# Patient Record
Sex: Female | Born: 1985
Health system: Southern US, Community
[De-identification: ages and names within clinical notes are randomized; demographics above are authoritative.]

## PROBLEM LIST (undated history)

## (undated) ENCOUNTER — Inpatient Hospital Stay (HOSPITAL_COMMUNITY): Payer: Self-pay

## (undated) DIAGNOSIS — IMO0002 Reserved for concepts with insufficient information to code with codable children: Secondary | ICD-10-CM

## (undated) DIAGNOSIS — J45909 Unspecified asthma, uncomplicated: Secondary | ICD-10-CM

## (undated) DIAGNOSIS — Z8669 Personal history of other diseases of the nervous system and sense organs: Secondary | ICD-10-CM

## (undated) DIAGNOSIS — R87619 Unspecified abnormal cytological findings in specimens from cervix uteri: Secondary | ICD-10-CM

## (undated) DIAGNOSIS — R51 Headache: Secondary | ICD-10-CM

## (undated) HISTORY — PX: CERVICAL CONE BIOPSY: SUR198

## (undated) HISTORY — DX: Personal history of other diseases of the nervous system and sense organs: Z86.69

## (undated) HISTORY — PX: ANKLE FRACTURE SURGERY: SHX122

## (undated) HISTORY — PX: OTHER SURGICAL HISTORY: SHX169

---
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
## 2007-08-02 ENCOUNTER — Encounter (INDEPENDENT_AMBULATORY_CARE_PROVIDER_SITE_OTHER): Payer: Self-pay | Admitting: Obstetrics and Gynecology

## 2007-08-02 ENCOUNTER — Ambulatory Visit (HOSPITAL_COMMUNITY): Admission: RE | Admit: 2007-08-02 | Discharge: 2007-08-02 | Payer: Self-pay | Admitting: Obstetrics and Gynecology

## 2008-07-02 ENCOUNTER — Emergency Department (HOSPITAL_COMMUNITY): Admission: EM | Admit: 2008-07-02 | Discharge: 2008-07-02 | Payer: Self-pay | Admitting: Emergency Medicine

## 2009-03-01 ENCOUNTER — Emergency Department (HOSPITAL_COMMUNITY): Admission: EM | Admit: 2009-03-01 | Discharge: 2009-03-02 | Payer: Self-pay | Admitting: Emergency Medicine

## 2009-03-07 ENCOUNTER — Emergency Department (HOSPITAL_COMMUNITY): Admission: EM | Admit: 2009-03-07 | Discharge: 2009-03-08 | Payer: Self-pay | Admitting: Emergency Medicine

## 2009-03-14 ENCOUNTER — Emergency Department (HOSPITAL_COMMUNITY): Admission: EM | Admit: 2009-03-14 | Discharge: 2009-03-14 | Payer: Self-pay | Admitting: Emergency Medicine

## 2009-03-17 ENCOUNTER — Inpatient Hospital Stay (HOSPITAL_COMMUNITY): Admission: AD | Admit: 2009-03-17 | Discharge: 2009-03-17 | Payer: Self-pay | Admitting: Family Medicine

## 2009-11-19 ENCOUNTER — Inpatient Hospital Stay (HOSPITAL_COMMUNITY): Admission: AD | Admit: 2009-11-19 | Discharge: 2009-11-19 | Payer: Self-pay | Admitting: Obstetrics and Gynecology

## 2009-12-14 ENCOUNTER — Emergency Department (HOSPITAL_COMMUNITY): Admission: EM | Admit: 2009-12-14 | Discharge: 2009-12-14 | Payer: Self-pay | Admitting: Emergency Medicine

## 2010-06-05 ENCOUNTER — Inpatient Hospital Stay (HOSPITAL_COMMUNITY): Admission: AD | Admit: 2010-06-05 | Discharge: 2010-06-05 | Payer: Self-pay | Admitting: Obstetrics and Gynecology

## 2010-06-06 ENCOUNTER — Inpatient Hospital Stay (HOSPITAL_COMMUNITY): Admission: AD | Admit: 2010-06-06 | Discharge: 2010-06-06 | Payer: Self-pay | Admitting: Obstetrics and Gynecology

## 2010-06-23 ENCOUNTER — Inpatient Hospital Stay (HOSPITAL_COMMUNITY): Admission: AD | Admit: 2010-06-23 | Discharge: 2010-06-24 | Payer: Self-pay | Admitting: Obstetrics and Gynecology

## 2010-06-24 ENCOUNTER — Inpatient Hospital Stay (HOSPITAL_COMMUNITY): Admission: AD | Admit: 2010-06-24 | Discharge: 2010-06-27 | Payer: Self-pay | Admitting: Obstetrics & Gynecology

## 2011-03-06 LAB — CBC
HCT: 37.2 % (ref 36.0–46.0)
HCT: 40.5 % (ref 36.0–46.0)
Hemoglobin: 13.8 g/dL (ref 12.0–15.0)
MCH: 32 pg (ref 26.0–34.0)
MCH: 32.3 pg (ref 26.0–34.0)
MCHC: 34.1 g/dL (ref 30.0–36.0)
MCHC: 34.2 g/dL (ref 30.0–36.0)
MCV: 93.8 fL (ref 78.0–100.0)
MCV: 94.3 fL (ref 78.0–100.0)
RDW: 14.4 % (ref 11.5–15.5)

## 2011-03-21 LAB — URINE MICROSCOPIC-ADD ON

## 2011-03-21 LAB — BASIC METABOLIC PANEL
Chloride: 108 mEq/L (ref 96–112)
Creatinine, Ser: 0.58 mg/dL (ref 0.4–1.2)
GFR calc Af Amer: >60 mL/min (ref >60)

## 2011-03-21 LAB — CBC
MCV: 93.7 fL (ref 78.0–100.0)
RBC: 4.26 MIL/uL (ref 3.87–5.11)
WBC: 6.9 10*3/uL (ref 4.0–10.5)

## 2011-03-21 LAB — URINALYSIS, ROUTINE W REFLEX MICROSCOPIC
Bilirubin Urine: NEGATIVE
Hgb urine dipstick: NEGATIVE
Protein, ur: 30 mg/dL — AB
Specific Gravity, Urine: 1.026 (ref 1.005–1.030)
Urobilinogen, UA: 1 mg/dL (ref 0.0–1.0)

## 2011-03-21 LAB — DIFFERENTIAL
Lymphocytes Relative: 21 % (ref 12–46)
Lymphs Abs: 1.5 10*3/uL (ref 0.7–4.0)
Monocytes Relative: 7 % (ref 3–12)
Neutro Abs: 5 10*3/uL (ref 1.7–7.7)
Neutrophils Relative %: 72 % (ref 43–77)

## 2011-03-22 LAB — URINALYSIS, ROUTINE W REFLEX MICROSCOPIC
Glucose, UA: NEGATIVE mg/dL
Hgb urine dipstick: NEGATIVE
Specific Gravity, Urine: 1.01 (ref 1.005–1.030)
pH: 8 (ref 5.0–8.0)

## 2011-03-25 ENCOUNTER — Emergency Department (HOSPITAL_COMMUNITY)
Admission: EM | Admit: 2011-03-25 | Discharge: 2011-03-25 | Disposition: A | Discharge status: Home / Self Care | Payer: Self-pay | Attending: Emergency Medicine | Admitting: Emergency Medicine

## 2011-03-25 DIAGNOSIS — R110 Nausea: Secondary | ICD-10-CM | POA: Insufficient documentation

## 2011-03-25 DIAGNOSIS — H53149 Visual discomfort, unspecified: Secondary | ICD-10-CM | POA: Insufficient documentation

## 2011-03-25 DIAGNOSIS — G43909 Migraine, unspecified, not intractable, without status migrainosus: Secondary | ICD-10-CM | POA: Insufficient documentation

## 2011-03-31 LAB — URINE MICROSCOPIC-ADD ON

## 2011-03-31 LAB — URINALYSIS, ROUTINE W REFLEX MICROSCOPIC
Bilirubin Urine: NEGATIVE
Glucose, UA: NEGATIVE mg/dL
Glucose, UA: NEGATIVE mg/dL
Ketones, ur: NEGATIVE mg/dL
Nitrite: NEGATIVE
Specific Gravity, Urine: 1.022 (ref 1.005–1.030)
pH: 6.5 (ref 5.0–8.0)
pH: 7.5 (ref 5.0–8.0)

## 2011-03-31 LAB — WET PREP, GENITAL
Clue Cells Wet Prep HPF POC: NONE SEEN
Trich, Wet Prep: NONE SEEN
Trich, Wet Prep: NONE SEEN
Yeast Wet Prep HPF POC: NONE SEEN

## 2011-03-31 LAB — GC/CHLAMYDIA PROBE AMP, GENITAL
Chlamydia, DNA Probe: NEGATIVE
GC Probe Amp, Genital: NEGATIVE

## 2011-05-03 NOTE — Op Note (Signed)
NAME:  Joanna Mcpherson, Joanna Mcpherson            ACCOUNT NO.:  [id number]   MEDICAL RECORD NO.:  [id number]          PATIENT TYPE:  AMB   LOCATION:  SDC                           FACILITY:  [facility]   PHYSICIAN:  Miguel Aschoff, M.D.       DATE OF BIRTH:  07/19/1986   DATE OF PROCEDURE:  08/02/2007  DATE OF DISCHARGE:                               OPERATIVE REPORT   PREOPERATIVE DIAGNOSIS:  Carcinoma in situ of cervix.   POSTOPERATIVE DIAGNOSIS:  Carcinoma in situ of cervix.   PROCEDURE:  Cold-knife conization of cervix and endocervical curettage.   SURGEON:  Dr. Miguel Aschoff.   ANESTHESIA:  General.   COMPLICATIONS:  None.   JUSTIFICATION:  The patient is a 25 year old black female who came in  for routine evaluation and Pap smear on July 12, 2007.  On examination,  her pelvic exam was unremarkable.  However, Pap smear returned with CIS.  Due to this significant abnormality on her Pap smear, she presents now  to undergo cone biopsy of the cervix to ensure that a more significant  lesion is not present and to both confirm the pathology and to correct  the problem.  Risks and benefits of the procedure were discussed with  the patient.   PROCEDURE:  The patient was taken to the operating room and placed in a  supine position.  General anesthesia was administered without  difficulty.  She was then placed in the dorsal lithotomy position,  prepped and draped in the usual sterile fashion.  Once this was done, a  speculum was placed in the vaginal vault.  Anterior cervical lip was  grasped with a tenaculum.  Figure-of-eight sutures of 0 chromic were  placed from the 2 to 4 o'clock positions and from the 8 o'clock to 10  o'clock positions in an effort to occlude ascending branch of the  cervical artery.  Once this was completed, the cervix was stained with  Lugol's solution.  There was a very large ectocervical lesion  encompassing the vast majority of the cervix.  After Lugol's solution  was placed, the  cervix was injected with 18 mL of 1% Xylocaine with  epinephrine.  This was placed for hemostasis.  Equal amounts were placed  at the 12, 4 and 8 o'clock positions.  After the cervix was blanched  using a cold-knife, the lesion was excised.  The lesion was so extensive  it was not possible to clear all the margins with the scalpel.  The  residual lesions were then treated with electrocautery.  The specimen  was removed, tagged at the 12 o'clock position.  Endocervical curettage  was then carried out.  The bed of the cone biopsy site was then  cauterized with electrocautery, and then the entire ectocervix was  treated with electrocautery an effort to remove any residual dysplastic  tissue.  Once this was completed, the defect was filled with a Gelfoam  pack, and then the previously placed sutures were tied across the cervix  to hold the Gelfoam in place.  The estimated blood loss was less than 10  mL.  The patient  tolerated the procedure well.   Plan is for the patient to be discharged home.  Medications for home  include Darvocet-N 100 one every 4 hours as needed for pain, doxycycline  1 twice a day for 3 days.  The patient is to call in 1 week for her  pathology report and to be seen back in 4 weeks for follow-up  examination.  She was instructed to place nothing in the vagina for 4  weeks and to call if there were any problems such as fever, pain or  heavy bleeding.      Miguel Aschoff, M.D.  Electronically Signed     AR/MEDQ  D:  08/02/2007  T:  08/03/2007  Job:  [id number]

## 2011-05-06 NOTE — Op Note (Signed)
NAME:  Joanna Mcpherson, Joanna Mcpherson            ACCOUNT NO.:  [id number]   MEDICAL RECORD NO.:  [id number]          PATIENT TYPE:  OIB   LOCATION:  5003                         FACILITY:  [facility]   PHYSICIAN:  Almedia Balls. Ranell Patrick, M.D. DATE OF BIRTH:  Apr 27, 1986   DATE OF PROCEDURE:  11/28/2005  DATE OF DISCHARGE:                                 OPERATIVE REPORT   PREOPERATIVE DIAGNOSIS:  Displaced pronation, external rotation ankle  fracture, unstable.   POSTOPERATIVE DIAGNOSIS:  Displaced pronation, external rotation ankle  fracture, unstable.   PROCEDURE PERFORMED:  Open reduction and internal fixation of left pronation  and external rotation ankle fracture with placement of syndesmosis screw.   ATTENDING SURGEON:  Almedia Balls. Ranell Patrick, M.D.   ASSISTANT:  Donnie Coffin. Durwin Nora, P.A.   General anesthesia was used.   ESTIMATED BLOOD LOSS:  Minimal.   TOURNIQUET TIME:  1 hour 10 minutes.   Instrument counts correct, no complications.  Perioperative antibiotics  given.   INDICATIONS:  The patient is an 25 year old female with a history of a  pronation-external rotation type injury to her ankle.  She presented to the  orthopedic clinic with a displaced mortise and a high fibular fracture,  indicating disruption of the distal tibiofibular syndesmosis.  The patient's  family and the patient were counseled regarding the unstable nature of this  injury and the need for reduction and fixation of the fracture and  stabilization of the syndesmosis.  Informed consent was obtained.   DESCRIPTION OF PROCEDURE:  After an adequate level of anesthesia was  achieved, the patient was positioned supine on the radiolucent operating  table.  The patient was sterilely prepped and draped in the usual manner.  After the placement of a nonsterile tourniquet, the limb was elevated and  exsanguinated using an Esmarch bandage.  The tourniquet was inflated to 300  mmHg.  A longitudinal skin incision was created with the  subcutaneous border  of the fibula, dissection carried sharply down through subcutaneous tissues.  The fibular was subperiosteally dissected.  The fracture site was identified  and anatomically reduced with crab claw clamps.  We then placed two anterior  and posterior lag screws, getting good fixation and stabilization of the  fracture.  We then placed a lateral neutralization plate, which was 3 5  reconstruction plate, 10 holes, which spanned the fracture site and gave [place]  six cortices proximally and six cortices distally to the fracture.  We then  bent the plate and contoured it appropriately, fixed the plate to the bone,  gaining good purchase bicortically proximally and distally.  We then checked  the mortise on a mortise view, both AP and lateral.  We noted the anatomic  reduction and fixation of the fibula; however, we noted some slight widening  still at the medial joint space, medial clear space.  We thus decided to go  ahead and make a small incision to inspect the medial joint.  We did through  a curvilinear incision directly overlying the anterior medial joint line.  Dissection carried down onto the saphenous vein.  We dissected that and  retracted it posteriorly  and then identified the joint line anteriorly.  We  punctured that with a Metzenbaum scissors and then gently opened that area  up, and we were able to notice the medial deltoid ligament was flipped into  the medial joint space.  We cleaned that, flipped that out, utilizing a  Therapist, nutritional, and there was also some redundant organizing clot and  hematoma and a little bit of fat that was drifting into the anteromedial  joint space.  We trimmed that out sharply using a 15 blade scalpel and  DeBakey forceps.  The remainder of the anterior joint was thoroughly  inspected and lavaged.  There was one small chip of cartilage off of the  anterior aspect of the medial malleolus that appeared to be traumatic and  related to her  injury.  Again we checked the deltoid to make sure that it  was out and then anatomically reduced the mortise under direct  visualization.  With the mortise reduced, we placed a Brooke Dare Kong clamp to  hold the mortise reduced and the distal syndesmosis reduced.  Then we placed  a single 4.0 fully-threaded cancellous screw through the distal hole in the  plate and across to the medial side of the tibia, gaining good purchase and  good fixation.  We were happy with reduction of the mortise now that it  appeared anatomic and with placement of the screw in multiple views.  Again  the ankle and the hardware were checked in multiple planes, and we were  happy with the placement and the fracture reduction.  The final pictures  were obtained.  We then thoroughly irrigated the joint, both medial and  lateral wounds, and then closed the wounds with layered closures of 2-0  Vicryl and a 4-0 running Monocryl for the skin.  The patient tolerated  surgery well, was taken to the recovery room.           ______________________________  Almedia Balls Ranell Patrick, M.D.     SRN/MEDQ  D:  11/28/2005  T:  11/29/2005  Job:  [id number]

## 2011-09-16 LAB — URINALYSIS, ROUTINE W REFLEX MICROSCOPIC
Bilirubin Urine: NEGATIVE
Glucose, UA: NEGATIVE
Hgb urine dipstick: NEGATIVE
Ketones, ur: NEGATIVE
Protein, ur: NEGATIVE
Urobilinogen, UA: 0.2

## 2011-09-16 LAB — GC/CHLAMYDIA PROBE AMP, GENITAL
Chlamydia, DNA Probe: NEGATIVE
GC Probe Amp, Genital: NEGATIVE

## 2011-09-16 LAB — WET PREP, GENITAL: Trich, Wet Prep: NONE SEEN

## 2012-05-16 ENCOUNTER — Encounter (HOSPITAL_COMMUNITY): Payer: Self-pay | Admitting: Emergency Medicine

## 2012-05-16 ENCOUNTER — Emergency Department (HOSPITAL_COMMUNITY)
Admission: EM | Admit: 2012-05-16 | Discharge: 2012-05-16 | Disposition: A | Discharge status: Home / Self Care | Payer: Self-pay | Attending: Emergency Medicine | Admitting: Emergency Medicine

## 2012-05-16 DIAGNOSIS — R1909 Other intra-abdominal and pelvic swelling, mass and lump: Secondary | ICD-10-CM | POA: Insufficient documentation

## 2012-05-16 DIAGNOSIS — B373 Candidiasis of vulva and vagina: Secondary | ICD-10-CM

## 2012-05-16 DIAGNOSIS — N898 Other specified noninflammatory disorders of vagina: Secondary | ICD-10-CM | POA: Insufficient documentation

## 2012-05-16 DIAGNOSIS — B3731 Acute candidiasis of vulva and vagina: Secondary | ICD-10-CM | POA: Insufficient documentation

## 2012-05-16 LAB — WET PREP, GENITAL: Trich, Wet Prep: NONE SEEN

## 2012-05-16 LAB — URINALYSIS, ROUTINE W REFLEX MICROSCOPIC
Bilirubin Urine: NEGATIVE
Glucose, UA: NEGATIVE mg/dL
Hgb urine dipstick: NEGATIVE
Ketones, ur: NEGATIVE mg/dL
Leukocytes, UA: NEGATIVE
Nitrite: NEGATIVE
Protein, ur: NEGATIVE mg/dL
Specific Gravity, Urine: 1.011 (ref 1.005–1.030)
Urobilinogen, UA: 0.2 mg/dL (ref 0.0–1.0)
pH: 6.5 (ref 5.0–8.0)

## 2012-05-16 LAB — PREGNANCY, URINE: Preg Test, Ur: NEGATIVE

## 2012-05-16 MED ORDER — FLUCONAZOLE 150 MG PO TABS
150.0000 mg | ORAL_TABLET | Freq: Once | ORAL | Status: AC
  Administered 2012-05-16: 150 mg via ORAL
  Filled 2012-05-16: qty 1

## 2012-05-16 NOTE — ED Provider Notes (Addendum)
History     CSN: [id number]  Arrival date & time 05/16/12  1924   First MD Initiated Contact with Patient 05/16/12 2125      Chief Complaint  Patient presents with  . Groin Swelling    (Consider location/radiation/quality/duration/timing/severity/associated sxs/prior treatment) HPI Comments: Swelling and tenderness in peri region   The history is provided by the patient.    History reviewed. No pertinent past medical history.  Past Surgical History  Procedure Date  . Ankle fracture surgery     Family History  Problem Relation Age of Onset  . Diabetes Mother     History  Substance Use Topics  . Smoking status: Never Smoker   . Smokeless tobacco: Not on file  . Alcohol Use: No    OB History    Grav Para Term Preterm Abortions TAB SAB Ect Mult Living                  Review of Systems  Constitutional: Negative for fever.  Genitourinary: Positive for vaginal pain. Negative for dysuria, vaginal bleeding and vaginal discharge.    Allergies  Review of patient's allergies indicates no known allergies.  Home Medications  No current outpatient prescriptions on file.  BP 120/79  Pulse 94  Temp(Src) 98.3 F (36.8 C) (Oral)  Resp 18  SpO2 98%  LMP 04/26/2012  Physical Exam  Constitutional: She is oriented to person, place, and time. She appears well-developed.  Neck: Normal range of motion.  Cardiovascular: Normal rate.   Abdominal: Soft. She exhibits no distension. There is no tenderness.  Genitourinary: Uterus normal. Vaginal discharge found.       Thick white, curdy vaginal discharge  Neurological: She is alert and oriented to person, place, and time.  Skin: Skin is warm. No rash noted.    ED Course  Procedures (including critical care time)  Labs Reviewed  WET PREP, GENITAL - Abnormal; Notable for the following:    Yeast Wet Prep HPF POC FEW (*)    WBC, Wet Prep HPF POC FEW (*)    All other components within normal limits  URINALYSIS, ROUTINE  W REFLEX MICROSCOPIC  PREGNANCY, URINE  GC/CHLAMYDIA PROBE AMP, GENITAL   No results found.   No diagnosis found.    MDM  Vaginal candidiasis        Arman Filter, NP 05/16/12 2215  Arman Filter, NP 05/16/12 4098  Arman Filter, NP 08/31/12 [phone]

## 2012-05-16 NOTE — ED Notes (Signed)
Pt states she feels like she has swelling in her perineal area  Pt denies vaginal discharge  Pt states she has been using medication for yeast infection without relief  Pt states it is painful with urination and states she has been urinating frequently

## 2012-05-16 NOTE — Discharge Instructions (Signed)
Candidal Vulvovaginitis Candidal vulvovaginitis is an infection of the vagina and vulva. The vulva is the skin around the opening of the vagina. This may cause itching and discomfort in and around the vagina.  HOME CARE  Only take medicine as told by your doctor.   Do not have sex (intercourse) until the infection is healed or as told by your doctor.   Practice safe sex.   Tell your sex partner about your infection.   Do not douche or use tampons.   Wear cotton underwear. Do not wear tight pants or panty hose.   Eat yogurt. This may help treat and prevent yeast infections.  GET HELP RIGHT AWAY IF:   You have a fever.   Your problems get worse during treatment or do not get better in 3 days.   You have discomfort, irritation, or itching in your vagina or vulva area.   You have pain after sex.   You start to get belly (abdominal) pain.  MAKE SURE YOU:  Understand these instructions.   Will watch your condition.   Will get help right away if you are not doing well or get worse.  Document Released: 03/03/2009 Document Revised: 11/24/2011 Document Reviewed: 03/03/2009 [facility] Patient Information 2012 [place], [place]. Given an  Antifungal to treat your symptoms should be the only medication, you need

## 2012-05-18 NOTE — ED Notes (Signed)
+  Chlamydia. Chart sent to EDP office for review. DHHS attached. 

## 2012-05-19 NOTE — ED Notes (Signed)
Called patient and notified them of +result. Wants RX called to [facility] on [address].

## 2012-05-19 NOTE — ED Notes (Signed)
Chart returned from EDP office. Recommend Azithromycin 1000 mg PO one dose only. Prescribed by Fayrene Helper PA-C.

## 2012-05-23 NOTE — ED Provider Notes (Signed)
Medical screening examination/treatment/procedure(s) were performed by non-physician practitioner and as supervising physician I was immediately available for consultation/collaboration.  Raeford Razor, MD 05/23/12 2255

## 2012-08-25 ENCOUNTER — Encounter (HOSPITAL_COMMUNITY): Payer: Self-pay

## 2012-08-25 ENCOUNTER — Inpatient Hospital Stay (HOSPITAL_COMMUNITY)
Admission: AD | Admit: 2012-08-25 | Discharge: 2012-08-25 | Disposition: A | Discharge status: Home / Self Care | Payer: Self-pay | Source: Ambulatory Visit | Attending: Obstetrics and Gynecology | Admitting: Obstetrics and Gynecology

## 2012-08-25 DIAGNOSIS — IMO0002 Reserved for concepts with insufficient information to code with codable children: Secondary | ICD-10-CM

## 2012-08-25 DIAGNOSIS — Z3201 Encounter for pregnancy test, result positive: Secondary | ICD-10-CM | POA: Insufficient documentation

## 2012-08-25 LAB — POCT PREGNANCY, URINE: Preg Test, Ur: POSITIVE — AB

## 2012-08-25 MED ORDER — CONCEPT OB 130-92.4-1 MG PO CAPS: 1.0000 | ORAL_CAPSULE | Freq: Every day | ORAL | Status: DC

## 2012-08-25 NOTE — MAU Note (Signed)
States had 3 positive pregnancy tests at home. No complaints at this time. States needs a pregnancy verification letter.

## 2012-08-25 NOTE — MAU Provider Note (Signed)
HPI: Joanna Mcpherson is a 26 y.o. year old G38P1011 female at [redacted]w[redacted]d weeks gestation who presents to MAU requesting pregnancy verification letter. No pain or vaginal bleeding.  Pregnancy verification letter given. Start Prenatal care.  [place], [place] 08/25/2012 8:03 PM

## 2012-08-27 NOTE — MAU Provider Note (Signed)
Agree with above note.  Joanna Mcpherson 08/27/2012 1:14 PM

## 2012-08-29 ENCOUNTER — Inpatient Hospital Stay (HOSPITAL_COMMUNITY)
Admission: AD | Admit: 2012-08-29 | Discharge: 2012-08-29 | Discharge status: Against Medical Advice | Payer: Self-pay | Source: Ambulatory Visit | Attending: Obstetrics and Gynecology | Admitting: Obstetrics and Gynecology

## 2012-08-29 DIAGNOSIS — R109 Unspecified abdominal pain: Secondary | ICD-10-CM | POA: Insufficient documentation

## 2012-08-29 DIAGNOSIS — N949 Unspecified condition associated with female genital organs and menstrual cycle: Secondary | ICD-10-CM | POA: Insufficient documentation

## 2012-08-29 LAB — URINALYSIS, ROUTINE W REFLEX MICROSCOPIC
Bilirubin Urine: NEGATIVE
Ketones, ur: NEGATIVE mg/dL
Nitrite: NEGATIVE
pH: 7 (ref 5.0–8.0)

## 2012-08-29 NOTE — MAU Note (Signed)
Patient states she started having right side pain last night. Denies any bleeding or vaginal discharge.

## 2012-08-29 NOTE — MAU Note (Signed)
Pt requesting to sign out AMA.

## 2012-08-29 NOTE — MAU Note (Signed)
RN discussed with pt benefits of further exam and risk of leaving.  Pt voices understanding and AMA form signed.

## 2012-08-31 NOTE — ED Provider Notes (Signed)
Medical screening examination/treatment/procedure(s) were performed by non-physician practitioner and as supervising physician I was immediately available for consultation/collaboration.  Raeford Razor, MD 08/31/12 [phone]

## 2012-09-05 ENCOUNTER — Encounter (HOSPITAL_COMMUNITY): Payer: Self-pay

## 2012-09-05 ENCOUNTER — Inpatient Hospital Stay (HOSPITAL_COMMUNITY)
Admission: AD | Admit: 2012-09-05 | Discharge: 2012-09-05 | Disposition: A | Discharge status: Home / Self Care | Payer: Self-pay | Source: Ambulatory Visit | Attending: Obstetrics & Gynecology | Admitting: Obstetrics & Gynecology

## 2012-09-05 DIAGNOSIS — Z3201 Encounter for pregnancy test, result positive: Secondary | ICD-10-CM

## 2012-09-05 DIAGNOSIS — O99891 Other specified diseases and conditions complicating pregnancy: Secondary | ICD-10-CM | POA: Insufficient documentation

## 2012-09-05 DIAGNOSIS — J069 Acute upper respiratory infection, unspecified: Secondary | ICD-10-CM | POA: Insufficient documentation

## 2012-09-05 HISTORY — DX: Unspecified asthma, uncomplicated: J45.909

## 2012-09-05 NOTE — MAU Note (Signed)
Patient states she has had symptoms of a cold for the past 3 days, headache, sore throat, cough and congestion. Denies any abdominal pain or bleeding.

## 2012-09-05 NOTE — MAU Provider Note (Signed)
  History     CSN: [id number]  Arrival date and time: 09/05/12 1044   First Provider Initiated Contact with Patient 09/05/12 1232      Chief Complaint  Patient presents with  . URI   HPI Joanna Mcpherson 26 y.o. [redacted]w[redacted]d  Comes to MAU today with cold symptoms in pregnancy (positive pregnancy test on 08-25-12).  See ROS.  No other complaints.  Has not started prenatal care and did not know what medication to take for her symptoms.  Nonsmoker.  OB History    Grav Para Term Preterm Abortions TAB SAB Ect Mult Living   3 1 1  1 1    1       Past Medical History  Diagnosis Date  . Asthma     Past Surgical History  Procedure Date  . Ankle fracture surgery   . Elective abortion     Family History  Problem Relation Age of Onset  . Diabetes Mother     History  Substance Use Topics  . Smoking status: Never Smoker   . Smokeless tobacco: Not on file  . Alcohol Use: No    Allergies: No Known Allergies  Prescriptions prior to admission  Medication Sig Dispense Refill  . Prenat w/o A Vit-FeFum-FePo-FA (CONCEPT OB) 130-92.4-1 MG CAPS Take 1 tablet by mouth daily.  30 capsule  12    Review of Systems  Constitutional: Negative for fever and chills.  HENT: Positive for congestion and sore throat. Negative for ear pain.   Respiratory: Positive for cough. Negative for sputum production and shortness of breath.   Cardiovascular:       Soreness in chest when coughing  Gastrointestinal: Positive for nausea. Negative for vomiting, abdominal pain, diarrhea and constipation.  Genitourinary:       No vaginal discharge. No vaginal bleeding. No dysuria.   Physical Exam   Blood pressure 113/76, pulse 99, temperature 98.8 F (37.1 C), temperature source Oral, resp. rate 18, height 5\' 6"  (1.676 m), weight 84.278 kg (185 lb 12.8 oz), last menstrual period 07/22/2012, SpO2 100.00%.  Physical Exam  Nursing note and vitals reviewed. Constitutional: She is oriented to person, place, and  time. She appears well-developed and well-nourished.  HENT:  Head: Normocephalic.  Mouth/Throat: No oropharyngeal exudate.       Pharynx pink, upper nasal tubinates pink and slightly edematous  Eyes: EOM are normal.  Neck: Neck supple.  Cardiovascular: Normal rate and regular rhythm.   No murmur heard. Respiratory: Breath sounds normal. No respiratory distress. She has no wheezes.  Musculoskeletal: Normal range of motion.  Neurological: She is alert and oriented to person, place, and time.  Skin: Skin is warm and dry.  Psychiatric: She has a normal mood and affect.    MAU Course  Procedures  MDM Afebrile, no abdominal pain, no vomiting other than usual pregnancy symptoms for her.  Assessment and Plan  Pregnancy URI  Plan Reviewed OTC meds she can take Return if develops fever or severe vomiting. Begin prenatal care as soon as possible.  BURLESON,TERRI 09/05/2012, 12:32 PM

## 2012-09-07 NOTE — MAU Provider Note (Signed)
Attestation of Attending Supervision of Advanced Practitioner (CNM/NP): Evaluation and management procedures were performed by the Advanced Practitioner under my supervision and collaboration.  I have reviewed the Advanced Practitioner's note and chart, and I agree with the management and plan.  HARRAWAY-SMITH, Becca Bayne 11:42 AM     

## 2012-09-28 LAB — OB RESULTS CONSOLE RPR: RPR: NONREACTIVE

## 2012-09-28 LAB — OB RESULTS CONSOLE GC/CHLAMYDIA: Chlamydia: NEGATIVE

## 2012-09-28 LAB — OB RESULTS CONSOLE HIV ANTIBODY (ROUTINE TESTING): HIV: NONREACTIVE

## 2012-09-28 LAB — OB RESULTS CONSOLE ABO/RH: RH Type: POSITIVE

## 2012-11-08 ENCOUNTER — Encounter (HOSPITAL_COMMUNITY): Payer: Self-pay | Admitting: *Deleted

## 2012-11-08 ENCOUNTER — Inpatient Hospital Stay (HOSPITAL_COMMUNITY)
Admission: AD | Admit: 2012-11-08 | Discharge: 2012-11-08 | Disposition: A | Discharge status: Home / Self Care | Payer: Medicaid Other | Source: Ambulatory Visit | Attending: Obstetrics & Gynecology | Admitting: Obstetrics & Gynecology

## 2012-11-08 DIAGNOSIS — R51 Headache: Secondary | ICD-10-CM | POA: Insufficient documentation

## 2012-11-08 DIAGNOSIS — O99891 Other specified diseases and conditions complicating pregnancy: Secondary | ICD-10-CM | POA: Insufficient documentation

## 2012-11-08 MED ORDER — BUTALBITAL-APAP-CAFFEINE 50-325-40 MG PO TABS
1.0000 | ORAL_TABLET | Freq: Once | ORAL | Status: AC
  Administered 2012-11-08: 1 via ORAL
  Filled 2012-11-08: qty 1

## 2012-11-08 NOTE — MAU Provider Note (Signed)
  History     CSN: [id number]  Arrival date and time: 11/08/12 0144   First Provider Initiated Contact with Patient 11/08/12 0210      Chief Complaint  Patient presents with  . Headache   HPI  Joanna Mcpherson is a 26 y.o. G3P1011 who presents today with a migraine. She has a hx of migraines, but has not had one in a long time. She has tried tylenol, but it did not help.   Past Medical History  Diagnosis Date  . Asthma     Past Surgical History  Procedure Date  . Ankle fracture surgery   . Elective abortion     Family History  Problem Relation Age of Onset  . Diabetes Mother     History  Substance Use Topics  . Smoking status: Never Smoker   . Smokeless tobacco: Not on file  . Alcohol Use: No    Allergies: No Known Allergies  Prescriptions prior to admission  Medication Sig Dispense Refill  . Prenat w/o A Vit-FeFum-FePo-FA (CONCEPT OB) 130-92.4-1 MG CAPS Take 1 tablet by mouth daily.  30 capsule  12    Review of Systems  Constitutional: Negative for fever.  Eyes: Positive for photophobia. Negative for blurred vision and double vision.  Respiratory: Negative for shortness of breath.   Cardiovascular: Negative for chest pain.  Gastrointestinal: Positive for nausea and vomiting. Negative for diarrhea and constipation.  Genitourinary: Negative for dysuria, urgency and frequency.  Neurological: Positive for headaches. Negative for dizziness and tingling.   Physical Exam   Blood pressure 106/68, pulse 100, temperature 98.8 F (37.1 C), temperature source Oral, resp. rate 18, height 5\' 5"  (1.651 m), weight 86.637 kg (191 lb), last menstrual period 07/22/2012.  Physical Exam  Nursing note and vitals reviewed. Constitutional: She is oriented to person, place, and time. She appears well-developed and well-nourished.  HENT:  Head: Normocephalic and atraumatic.  Cardiovascular: Normal rate.   Respiratory: Effort normal.  GI: Soft.  Neurological: She is alert  and oriented to person, place, and time.  Skin: Skin is warm and dry.    MAU Course  Procedures  Pt reports adequate pain relief from fioricet  Assessment and Plan  Migraine headache FU with PCP as scheduled.   Tawnya Crook 11/08/2012, 2:10 AM

## 2012-11-08 NOTE — MAU Note (Signed)
Pt reports "i have had a headache all day, took tylenol and slept a while but when i got up the headache was worse. i took more tylenol but it is just getting worse", vomiting x 2 , photosensitive.

## 2012-12-19 NOTE — L&D Delivery Note (Signed)
Delivery Note At 10:41 AM a viable female was delivered via  (Presentation: ROA).  Placenta status: intact, meconium-stained.  Cord:  with the following complications: none.  Anesthesia:  Epidural Episiotomy: none Lacerations: none Suture Repair: 3.0 vicryl rapide Est. Blood Loss (mL): 100 ml  Mom to postpartum.  Baby to nursery-stable.  JACKSON-MOORE,Konica Stankowski A 04/21/2013, 10:53 AM

## 2013-01-17 LAB — OB RESULTS CONSOLE RPR: RPR: NONREACTIVE

## 2013-01-17 LAB — OB RESULTS CONSOLE HIV ANTIBODY (ROUTINE TESTING): HIV: NONREACTIVE

## 2013-03-07 ENCOUNTER — Ambulatory Visit (INDEPENDENT_AMBULATORY_CARE_PROVIDER_SITE_OTHER): Payer: Medicaid Other | Admitting: Obstetrics

## 2013-03-07 ENCOUNTER — Encounter: Payer: Self-pay | Admitting: Obstetrics

## 2013-03-07 VITALS — BP 120/74 | Temp 97.7°F | Wt 199.0 lb

## 2013-03-07 DIAGNOSIS — Z3483 Encounter for supervision of other normal pregnancy, third trimester: Secondary | ICD-10-CM

## 2013-03-07 LAB — POCT URINALYSIS DIPSTICK
Blood, UA: NEGATIVE
Ketones, UA: NEGATIVE
Spec Grav, UA: 1.015
Urobilinogen, UA: NEGATIVE

## 2013-03-07 NOTE — Progress Notes (Signed)
No complaints. Doing well.

## 2013-03-07 NOTE — Progress Notes (Signed)
Pulse- 98 

## 2013-03-11 ENCOUNTER — Telehealth: Payer: Self-pay | Admitting: *Deleted

## 2013-03-11 NOTE — Telephone Encounter (Signed)
Pt reports she is having an increase in pelvic pressure and pelvic pain x2d. Pt states she is [redacted] weeks pregnant and was on vacation last week at the beach. Pt states she came back yesterday and is not comfortable today. Pt states no change in discharge and no contractions. Some increased moisture. Reviewed reasons for hip pain and pelvic pressure as the baby starts to move down and engage the pelvis- recommended comfort measures- rest, warmbath, heat, tylenol. Patient's next appointment is 4/7. Patient instructed to call if symptoms get worse, contractions start, or she feels she needs to be seen. Option of maternity belt discussed- pt works in a call center.

## 2013-03-14 ENCOUNTER — Ambulatory Visit (INDEPENDENT_AMBULATORY_CARE_PROVIDER_SITE_OTHER): Payer: Medicaid Other | Admitting: Obstetrics

## 2013-03-14 VITALS — BP 116/73 | Temp 97.9°F | Wt 202.0 lb

## 2013-03-14 DIAGNOSIS — Z3480 Encounter for supervision of other normal pregnancy, unspecified trimester: Secondary | ICD-10-CM

## 2013-03-14 LAB — POCT URINALYSIS DIPSTICK
Bilirubin, UA: NEGATIVE
Glucose, UA: NEGATIVE
Ketones, UA: NEGATIVE
Leukocytes, UA: NEGATIVE
Nitrite, UA: NEGATIVE

## 2013-03-14 NOTE — Progress Notes (Signed)
Pulse-106 Pt reports UC's starting last evening with constant pelvic pressure.

## 2013-03-14 NOTE — Progress Notes (Signed)
Contractions and pressure.

## 2013-03-14 NOTE — Patient Instructions (Signed)
Labor precautions

## 2013-03-17 ENCOUNTER — Encounter (HOSPITAL_COMMUNITY): Payer: Self-pay | Admitting: *Deleted

## 2013-03-17 ENCOUNTER — Inpatient Hospital Stay (HOSPITAL_COMMUNITY)
Admission: AD | Admit: 2013-03-17 | Discharge: 2013-03-18 | Disposition: A | Discharge status: Hospice | Payer: Medicaid Other | Source: Ambulatory Visit | Attending: Obstetrics | Admitting: Obstetrics

## 2013-03-17 DIAGNOSIS — O2390 Unspecified genitourinary tract infection in pregnancy, unspecified trimester: Secondary | ICD-10-CM | POA: Insufficient documentation

## 2013-03-17 DIAGNOSIS — B9689 Other specified bacterial agents as the cause of diseases classified elsewhere: Secondary | ICD-10-CM | POA: Insufficient documentation

## 2013-03-17 DIAGNOSIS — O99891 Other specified diseases and conditions complicating pregnancy: Secondary | ICD-10-CM | POA: Insufficient documentation

## 2013-03-17 DIAGNOSIS — A499 Bacterial infection, unspecified: Secondary | ICD-10-CM | POA: Insufficient documentation

## 2013-03-17 DIAGNOSIS — N760 Acute vaginitis: Secondary | ICD-10-CM | POA: Insufficient documentation

## 2013-03-17 DIAGNOSIS — N949 Unspecified condition associated with female genital organs and menstrual cycle: Secondary | ICD-10-CM | POA: Insufficient documentation

## 2013-03-17 HISTORY — DX: Reserved for concepts with insufficient information to code with codable children: IMO0002

## 2013-03-17 HISTORY — DX: Headache: R51

## 2013-03-17 HISTORY — DX: Unspecified abnormal cytological findings in specimens from cervix uteri: R87.619

## 2013-03-17 LAB — WET PREP, GENITAL
Trich, Wet Prep: NONE SEEN
Yeast Wet Prep HPF POC: NONE SEEN

## 2013-03-17 LAB — AMNISURE RUPTURE OF MEMBRANE (ROM) NOT AT ARMC: Amnisure ROM: NEGATIVE

## 2013-03-17 MED ORDER — METRONIDAZOLE 500 MG PO TABS: 500.0000 mg | ORAL_TABLET | Freq: Two times a day (BID) | ORAL | Status: DC

## 2013-03-17 NOTE — MAU Note (Signed)
Pt G3 P1 at 34wks having pelvic pressure and leaking clear fluid since 2000.  Denies any problems with pregnancy.

## 2013-03-17 NOTE — MAU Provider Note (Signed)
History     CSN: [id number]  Arrival date and time: 03/17/13 2204   None     Chief Complaint  Patient presents with  . Rupture of Membranes  . Pelvic Pain   HPI This is a 27 y.o. female at [redacted]w[redacted]d who presents with report of wetness and pressure. The pressure has been there for a while, Denies contractions.  Denies bleeding and reports + fetal movement.  RN Note: Pt G3 P1 at 34wks having pelvic pressure and leaking clear fluid since 2000. Denies any problems with pregnancy.      OB History   Grav Para Term Preterm Abortions TAB SAB Ect Mult Living   3 1 1  1 1    1       Past Medical History  Diagnosis Date  . Asthma   . Hx of migraines   . Abnormal Pap smear   . Headache     Past Surgical History  Procedure Laterality Date  . Ankle fracture surgery    . Elective abortion    . Cervical cone biopsy      Family History  Problem Relation Age of Onset  . Diabetes Mother     History  Substance Use Topics  . Smoking status: Former Smoker -- 0.25 packs/day for 2 years    Types: Cigarettes    Quit date: 06/17/2012  . Smokeless tobacco: Never Used  . Alcohol Use: No    Allergies: No Known Allergies  Prescriptions prior to admission  Medication Sig Dispense Refill  . butalbital-acetaminophen-caffeine (FIORICET, ESGIC) 50-325-40 MG per tablet Take 1 tablet by mouth 2 (two) times daily as needed for headache.      . Prenatal Vit-Fe Fumarate-FA (PRENATAL MULTIVITAMIN) TABS Take 1 tablet by mouth daily at 12 noon.        Review of Systems  Constitutional: Negative for fever, chills and malaise/fatigue.  Gastrointestinal: Negative for nausea, vomiting, abdominal pain, diarrhea and constipation.       Pelvic pressure   Genitourinary: Negative for dysuria.       Leaking, wetness   Neurological: Negative for dizziness and weakness.   Physical Exam   Blood pressure 116/64, pulse 96, temperature 98.1 F (36.7 C), temperature source Oral, resp. rate 18, height  5\' 5"  (1.651 m), weight 208 lb 9.6 oz (94.62 kg), last menstrual period 07/22/2012.  Physical Exam  Constitutional: She is oriented to person, place, and time. She appears well-developed and well-nourished. No distress.  HENT:  Head: Normocephalic.  Cardiovascular: Normal rate.   Respiratory: Effort normal.  GI: Soft. She exhibits no distension and no mass. There is no tenderness. There is no rebound and no guarding.  Genitourinary: Uterus normal. Vaginal discharge (frothy, white liquid, no pooling, +ferning, + sperm, lots of WBCs, no blood, Cervix FT/40/-3/ball) found.  Musculoskeletal: Normal range of motion.  Neurological: She is alert and oriented to person, place, and time.  Skin: Skin is warm and dry.  Psychiatric: She has a normal mood and affect.   FHR reactive No contractions  MAU Course  Procedures  MDM Will do Amnisure to make sure, since only saw one spot of ferning, and history is not strong for ROM  Results for orders placed during the hospital encounter of 03/17/13 (from the past 72 hour(s))  WET PREP, GENITAL     Status: Abnormal   Collection Time    03/17/13 11:00 PM      Result Value Range   Yeast Wet Prep HPF POC NONE SEEN  NONE SEEN   Trich, Wet Prep NONE SEEN  NONE SEEN   Clue Cells Wet Prep HPF POC MODERATE (*) NONE SEEN   WBC, Wet Prep HPF POC FEW (*) NONE SEEN   Comment: MANY BACTERIA SEEN  AMNISURE RUPTURE OF MEMBRANE (ROM)     Status: None   Collection Time    03/17/13 11:02 PM      Result Value Range   Amnisure ROM NEGATIVE      Assessment and Plan  A:  SIUP at [redacted]w[redacted]d       BV  P:  Discharge        PTL precautions      Rx Flagyl      Call office in am for further instructions   [facility] 03/17/2013, 10:49 PM

## 2013-03-18 ENCOUNTER — Telehealth: Payer: Self-pay | Admitting: *Deleted

## 2013-03-18 NOTE — Telephone Encounter (Signed)
Pt states she just wanted to inform Dr. Clearance Coots she went to MAU on 03/17/13. Pt was treat for BV. Pt states she was informed that her cervix was thinning, she was 40% effaced and slightly opened. Pt has a follow up appt 03/25/13. Is she ok to keep this appointment?

## 2013-03-18 NOTE — Telephone Encounter (Signed)
Pt states she was seen in MAU this weekend. Pt states she has been has having discharge and pelvic pain.

## 2013-03-21 ENCOUNTER — Encounter: Payer: Self-pay | Admitting: *Deleted

## 2013-03-25 ENCOUNTER — Encounter: Payer: Self-pay | Admitting: Obstetrics & Gynecology

## 2013-03-25 ENCOUNTER — Encounter: Payer: Medicaid Other | Admitting: Obstetrics

## 2013-03-25 ENCOUNTER — Ambulatory Visit (INDEPENDENT_AMBULATORY_CARE_PROVIDER_SITE_OTHER): Payer: Medicaid Other | Admitting: Obstetrics & Gynecology

## 2013-03-25 VITALS — BP 113/72 | Temp 97.7°F | Wt 204.0 lb

## 2013-03-25 DIAGNOSIS — Z3480 Encounter for supervision of other normal pregnancy, unspecified trimester: Secondary | ICD-10-CM

## 2013-03-25 DIAGNOSIS — Z3483 Encounter for supervision of other normal pregnancy, third trimester: Secondary | ICD-10-CM

## 2013-03-25 LAB — OB RESULTS CONSOLE GBS: GBS: NEGATIVE

## 2013-03-25 NOTE — Progress Notes (Signed)
Doing well 

## 2013-03-25 NOTE — Patient Instructions (Signed)
Patient information: Group B streptococcus and pregnancy (Beyond the Basics)  Authors Karen M Puopolo, MD, PhD Carol J Baker, MD Section Editors Charles J Lockwood, MD Daniel J Sexton, MD Deputy Editor Vanessa A Barss, MD Disclosures  All topics are updated as new evidence becomes available and our peer review process is complete.  Literature review current through: Feb 2014.  This topic last updated: Jun 18, 2012.  INTRODUCTION - Group B streptococcus (GBS) is a bacterium that can cause serious infections in pregnant women and newborn babies. GBS is one of many types of streptococcal bacteria, sometimes called "strep." This article discusses GBS, its effect on pregnant women and infants, and ways to prevent complications of GBS. More detailed information about GBS is available by subscription. (See "Group B streptococcal infection in pregnant women".) WHAT IS GROUP B STREP INFECTION? - GBS is commonly found in the digestive system and the vagina. In healthy adults, GBS is not harmful and does not cause problems. But in pregnant women and newborn infants, being infected with GBS can cause serious illness. Approximately one in three to four pregnant women in the [place] carries GBS in their gastrointestinal system and/or in their vagina. Carrying GBS is not the same as being infected. Carriers are not sick and do not need treatment during pregnancy. There is no treatment that can stop you from carrying GBS.  Pregnant women who are carriers of GBS infrequently become infected with GBS. GBS can cause urinary tract infections, infection of the amniotic fluid (bag of water), and infection of the uterus after delivery. GBS infections during pregnancy may lead to preterm labor.  Pregnant women who carry GBS can pass on the bacteria to their newborns, and some of those babies become infected with GBS. Newborns who are infected with GBS can develop pneumonia (lung infection), septicemia (blood infection), or  meningitis (infection of the lining of the brain and spinal cord). These complications can be prevented by giving intravenous antibiotics during labor to any woman who is at risk of GBS infection. You are at risk of GBS infection if: You have a urine culture during your current pregnancy showing GBS  You have a vaginal and rectal culture during your current pregnancy showing GBS  You had an infant infected with GBS in the past GROUP B STREP PREVENTION - Most doctors and nurses recommend a urine culture early in your pregnancy to be sure that you do not have a bladder infection without symptoms. If you urine culture shows GBS or other bacteria, you may be treated with an antibiotic. If you have symptoms of urinary infection, such as pain with urination, any time during your pregnancy, a urine culture is done. If GBS grows from the urine culture, it should be treated with an antibiotic, and you should also receive intravenous antibiotics during labor. Expert groups recommend that all pregnant women have a GBS culture at 35 to 37 weeks of pregnancy. The culture is done by swabbing the vagina and rectum. If your GBS culture is positive, you will be given an intravenous antibiotic during labor. If you have preterm labor, the culture is done then and an intravenous antibiotic is given until the baby is born or the labor is stopped by your health care provider. If you have a positive GBS culture and you have an allergy to penicillin, be sure your doctor and nurse are aware of this allergy and tell them what happened with the allergy. If you had only a rash or itching, this   is not a serious allergy, and you can receive a common drug related to the penicillin. If you had a serious allergy (for example, trouble breathing, swelling of your face) you may need an additional test to determine which antibiotic should be used during labor. Being treated with an antibiotic during labor greatly reduces the chance that you or  your newborn will develop infections related to GBS. It is important to note that young infants up to age 3 months can also develop septicemia, meningitis and other serious infections from GBS. Being treated with an antibiotic during labor does not reduce the chance that your baby will develop this later type of infection. There is currently no known way of preventing this later-onset GBS disease. WHERE TO GET MORE INFORMATION - Your healthcare provider is the best source of information for questions and concerns related to your medical problem.  

## 2013-03-25 NOTE — Progress Notes (Signed)
Pulse-102 No complaints.

## 2013-03-25 NOTE — Progress Notes (Signed)
SG 1005, pH 7, all perimeters negative

## 2013-03-25 NOTE — Telephone Encounter (Signed)
Pt in office 03-25-13 to see Dr. Tamela Oddi

## 2013-03-26 LAB — GC/CHLAMYDIA PROBE AMP: GC Probe RNA: NEGATIVE

## 2013-04-01 ENCOUNTER — Encounter (HOSPITAL_COMMUNITY): Payer: Self-pay | Admitting: *Deleted

## 2013-04-01 ENCOUNTER — Inpatient Hospital Stay (HOSPITAL_COMMUNITY)
Admission: AD | Admit: 2013-04-01 | Discharge: 2013-04-01 | Disposition: A | Discharge status: Home / Self Care | Payer: Medicaid Other | Source: Ambulatory Visit | Attending: Obstetrics | Admitting: Obstetrics

## 2013-04-01 DIAGNOSIS — O99891 Other specified diseases and conditions complicating pregnancy: Secondary | ICD-10-CM | POA: Insufficient documentation

## 2013-04-01 DIAGNOSIS — R42 Dizziness and giddiness: Secondary | ICD-10-CM | POA: Insufficient documentation

## 2013-04-01 LAB — COMPREHENSIVE METABOLIC PANEL
ALT: 6 U/L (ref 0–35)
Albumin: 2.6 g/dL — ABNORMAL LOW (ref 3.5–5.2)
Alkaline Phosphatase: 99 U/L (ref 39–117)
Chloride: 104 mEq/L (ref 96–112)
Potassium: 3.7 mEq/L (ref 3.5–5.1)
Sodium: 134 mEq/L — ABNORMAL LOW (ref 135–145)
Total Protein: 5.9 g/dL — ABNORMAL LOW (ref 6.0–8.3)

## 2013-04-01 LAB — CBC
Hemoglobin: 11.8 g/dL — ABNORMAL LOW (ref 12.0–15.0)
MCHC: 33.9 g/dL (ref 30.0–36.0)
RDW: 13.9 % (ref 11.5–15.5)
WBC: 5.6 10*3/uL (ref 4.0–10.5)

## 2013-04-01 LAB — URINALYSIS, ROUTINE W REFLEX MICROSCOPIC
Ketones, ur: NEGATIVE mg/dL
Leukocytes, UA: NEGATIVE
Nitrite: NEGATIVE
Protein, ur: NEGATIVE mg/dL
pH: 7 (ref 5.0–8.0)

## 2013-04-01 NOTE — MAU Note (Signed)
Name and DOB confirmed, pt verified spelling is correct on ID band.

## 2013-04-01 NOTE — MAU Provider Note (Signed)
History     CSN: [id number]  Arrival date and time: 04/01/13 1151   None     Chief Complaint  Patient presents with  . Dizziness   HPI 27 y.o. G3P1011 at [redacted]w[redacted]d with dizzy spells for the past few weeks, getting worse. Happens randomly, sitting, standing, lying down, last about 45 minutes each time. Eating every few hours at least and drinking 8-10 bottles of water a day. + fetal movement. Does c/o some allergy symptoms/congestion. Not taking any allergy medications.     Past Medical History  Diagnosis Date  . Asthma   . Hx of migraines   . Abnormal Pap smear   . Headache     Past Surgical History  Procedure Laterality Date  . Ankle fracture surgery    . Elective abortion    . Cervical cone biopsy      Family History  Problem Relation Age of Onset  . Diabetes Mother     History  Substance Use Topics  . Smoking status: Former Smoker -- 0.25 packs/day for 2 years    Types: Cigarettes    Quit date: 06/17/2012  . Smokeless tobacco: Never Used  . Alcohol Use: No    Allergies: No Known Allergies  Prescriptions prior to admission  Medication Sig Dispense Refill  . Prenatal Vit-Fe Fumarate-FA (PRENATAL MULTIVITAMIN) TABS Take 1 tablet by mouth daily at 12 noon.      . butalbital-acetaminophen-caffeine (FIORICET, ESGIC) 50-325-40 MG per tablet Take 1 tablet by mouth 2 (two) times daily as needed for headache.        Review of Systems  Constitutional: Negative.  Negative for fever.  Eyes: Negative for blurred vision.  Respiratory: Negative.  Negative for shortness of breath.   Cardiovascular: Negative.  Negative for palpitations.  Gastrointestinal: Negative for nausea, vomiting, abdominal pain, diarrhea and constipation.  Genitourinary: Negative for dysuria, urgency, frequency, hematuria and flank pain.       Negative for vaginal bleeding, cramping/contractions  Musculoskeletal: Negative.   Neurological: Positive for dizziness. Negative for seizures, loss of  consciousness and headaches.  Psychiatric/Behavioral: Negative.    Physical Exam   Blood pressure 95/56, pulse 91, temperature 98.4 F (36.9 C), temperature source Oral, resp. rate 20, height 5\' 4"  (1.626 m), weight 202 lb (91.627 kg), last menstrual period 07/22/2012, SpO2 99.00%.  Patient Vitals for the past 24 hrs:  BP Temp Temp src Pulse Resp SpO2 Height Weight  04/01/13 1347 95/56 mmHg 98.4 F (36.9 C) Oral 91 20 - - -  04/01/13 1256 113/73 mmHg - - 95 20 - - -  04/01/13 1255 115/80 mmHg - - 116 20 - - -  04/01/13 1253 113/74 mmHg - - 96 20 - - -  04/01/13 1252 101/68 mmHg - - 104 18 - - -  04/01/13 1217 - - - 107 - 99 % - -  04/01/13 1200 104/70 mmHg 98.4 F (36.9 C) Oral 120 18 - 5\' 4"  (1.626 m) 202 lb (91.627 kg)     Physical Exam  Nursing note and vitals reviewed. Constitutional: She is oriented to person, place, and time. She appears well-developed and well-nourished. No distress.  Cardiovascular: Normal rate and regular rhythm.   Respiratory: Effort normal. No respiratory distress.  GI: Soft. There is no tenderness.  Musculoskeletal: Normal range of motion.  Neurological: She is alert and oriented to person, place, and time. No cranial nerve deficit.  Skin: Skin is warm and dry. She is not diaphoretic.  Psychiatric: She has a  normal mood and affect.   Reactive NST MAU Course  Procedures Results for orders placed during the hospital encounter of 04/01/13 (from the past 24 hour(s))  URINALYSIS, ROUTINE W REFLEX MICROSCOPIC     Status: Abnormal   Collection Time    04/01/13 12:05 PM      Result Value Range   Color, Urine STRAW (*) YELLOW   APPearance CLEAR  CLEAR   Specific Gravity, Urine <1.005 (*) 1.005 - 1.030   pH 7.0  5.0 - 8.0   Glucose, UA NEGATIVE  NEGATIVE mg/dL   Hgb urine dipstick NEGATIVE  NEGATIVE   Bilirubin Urine NEGATIVE  NEGATIVE   Ketones, ur NEGATIVE  NEGATIVE mg/dL   Protein, ur NEGATIVE  NEGATIVE mg/dL   Urobilinogen, UA 0.2  0.0 - 1.0  mg/dL   Nitrite NEGATIVE  NEGATIVE   Leukocytes, UA NEGATIVE  NEGATIVE  CBC     Status: Abnormal   Collection Time    04/01/13 12:35 PM      Result Value Range   WBC 5.6  4.0 - 10.5 K/uL   RBC 3.86 (*) 3.87 - 5.11 MIL/uL   Hemoglobin 11.8 (*) 12.0 - 15.0 g/dL   HCT 16.1 (*) 09.6 - 04.5 %   MCV 90.2  78.0 - 100.0 fL   MCH 30.6  26.0 - 34.0 pg   MCHC 33.9  30.0 - 36.0 g/dL   RDW 40.9  81.1 - 91.4 %   Platelets 143 (*) 150 - 400 K/uL  COMPREHENSIVE METABOLIC PANEL     Status: Abnormal   Collection Time    04/01/13 12:35 PM      Result Value Range   Sodium 134 (*) 135 - 145 mEq/L   Potassium 3.7  3.5 - 5.1 mEq/L   Chloride 104  96 - 112 mEq/L   CO2 21  19 - 32 mEq/L   Glucose, Bld 92  70 - 99 mg/dL   BUN 6  6 - 23 mg/dL   Creatinine, Ser 7.82  0.50 - 1.10 mg/dL   Calcium 8.5  8.4 - 95.6 mg/dL   Total Protein 5.9 (*) 6.0 - 8.3 g/dL   Albumin 2.6 (*) 3.5 - 5.2 g/dL   AST 12  0 - 37 U/L   ALT 6  0 - 35 U/L   Alkaline Phosphatase 99  39 - 117 U/L   Total Bilirubin 0.3  0.3 - 1.2 mg/dL   GFR calc non Af Amer >90  >90 mL/min   GFR calc Af Amer >90  >90 mL/min     Assessment and Plan  27 y.o. G3P1011 at [redacted]w[redacted]d with dizziness - labs and VS WNL Reassured - may be related to normal discomforts of pregnancy or allergies - will try claritin/sudafed/saline nasal spray F/U tomorrow at return OB appointment   FRAZIER,NATALIE 04/01/2013, 1:50 PM

## 2013-04-01 NOTE — MAU Note (Addendum)
Extreme light headed/ dizzy episodes. Ongoing daily problem. MD has taken her out of work.  Seems to be getting worse, gets diaphoretic and arms are going numb.

## 2013-04-02 ENCOUNTER — Ambulatory Visit (INDEPENDENT_AMBULATORY_CARE_PROVIDER_SITE_OTHER): Payer: Medicaid Other | Admitting: Obstetrics

## 2013-04-02 ENCOUNTER — Encounter: Payer: Self-pay | Admitting: Obstetrics

## 2013-04-02 VITALS — BP 105/70 | Temp 98.2°F | Wt 202.0 lb

## 2013-04-02 DIAGNOSIS — Z3480 Encounter for supervision of other normal pregnancy, unspecified trimester: Secondary | ICD-10-CM

## 2013-04-02 DIAGNOSIS — Z3483 Encounter for supervision of other normal pregnancy, third trimester: Secondary | ICD-10-CM

## 2013-04-02 NOTE — Progress Notes (Signed)
SG 1005, 8

## 2013-04-02 NOTE — Progress Notes (Signed)
Pulse- 101 

## 2013-04-09 ENCOUNTER — Encounter: Payer: Self-pay | Admitting: Obstetrics

## 2013-04-09 ENCOUNTER — Ambulatory Visit (INDEPENDENT_AMBULATORY_CARE_PROVIDER_SITE_OTHER): Payer: Medicaid Other | Admitting: Obstetrics

## 2013-04-09 VITALS — BP 126/82 | Temp 98.4°F | Wt 204.0 lb

## 2013-04-09 DIAGNOSIS — K219 Gastro-esophageal reflux disease without esophagitis: Secondary | ICD-10-CM

## 2013-04-09 DIAGNOSIS — Z3480 Encounter for supervision of other normal pregnancy, unspecified trimester: Secondary | ICD-10-CM

## 2013-04-09 DIAGNOSIS — Z3483 Encounter for supervision of other normal pregnancy, third trimester: Secondary | ICD-10-CM

## 2013-04-09 LAB — POCT URINALYSIS DIPSTICK
Bilirubin, UA: NEGATIVE
Blood, UA: NEGATIVE
Ketones, UA: NEGATIVE
Spec Grav, UA: <=1.005
pH, UA: 7

## 2013-04-09 MED ORDER — OMEPRAZOLE 20 MG PO CPDR: 20.0000 mg | DELAYED_RELEASE_CAPSULE | Freq: Every day | ORAL | Status: DC

## 2013-04-09 NOTE — Progress Notes (Signed)
Pulse-109 Pt c/o intermittent stabbing lower back pain and pelvic pressure. Pt reports "mucous plug" discharge today.

## 2013-04-09 NOTE — Patient Instructions (Signed)
GERD

## 2013-04-16 ENCOUNTER — Inpatient Hospital Stay (HOSPITAL_COMMUNITY)
Admission: AD | Admit: 2013-04-16 | Discharge: 2013-04-16 | Disposition: A | Discharge status: Home / Self Care | Payer: Medicaid Other | Source: Ambulatory Visit | Attending: Obstetrics | Admitting: Obstetrics

## 2013-04-16 ENCOUNTER — Encounter (HOSPITAL_COMMUNITY): Payer: Self-pay | Admitting: *Deleted

## 2013-04-16 ENCOUNTER — Encounter: Payer: Self-pay | Admitting: *Deleted

## 2013-04-16 DIAGNOSIS — N949 Unspecified condition associated with female genital organs and menstrual cycle: Secondary | ICD-10-CM | POA: Insufficient documentation

## 2013-04-16 DIAGNOSIS — O479 False labor, unspecified: Secondary | ICD-10-CM | POA: Insufficient documentation

## 2013-04-16 NOTE — MAU Note (Signed)
C/o pelvic pain since last night around1900;

## 2013-04-16 NOTE — MAU Note (Signed)
Patient states she had been having irregular contractions during the night but this am is having a constant pelvic pain and pressure. Denies bleeding or leaking but does have an increase in vaginal discharge. Reports good fetal movement.

## 2013-04-17 ENCOUNTER — Ambulatory Visit (INDEPENDENT_AMBULATORY_CARE_PROVIDER_SITE_OTHER): Payer: Medicaid Other | Admitting: Obstetrics

## 2013-04-17 VITALS — BP 117/80 | Temp 98.1°F | Wt 204.4 lb

## 2013-04-17 DIAGNOSIS — Z3483 Encounter for supervision of other normal pregnancy, third trimester: Secondary | ICD-10-CM

## 2013-04-17 DIAGNOSIS — Z3480 Encounter for supervision of other normal pregnancy, unspecified trimester: Secondary | ICD-10-CM

## 2013-04-17 LAB — POCT URINALYSIS DIPSTICK
Bilirubin, UA: NEGATIVE
Blood, UA: NEGATIVE
Glucose, UA: NEGATIVE
Ketones, UA: NEGATIVE
Nitrite, UA: NEGATIVE
Spec Grav, UA: 1.01

## 2013-04-17 NOTE — Progress Notes (Signed)
Pulse-105 

## 2013-04-18 ENCOUNTER — Encounter: Payer: Self-pay | Admitting: Obstetrics

## 2013-04-21 ENCOUNTER — Inpatient Hospital Stay (HOSPITAL_COMMUNITY)
Admission: AD | Admit: 2013-04-21 | Discharge: 2013-04-22 | DRG: 775 | Disposition: A | Discharge status: Home / Self Care | Payer: Medicaid Other | Source: Ambulatory Visit | Attending: Obstetrics | Admitting: Obstetrics

## 2013-04-21 ENCOUNTER — Encounter (HOSPITAL_COMMUNITY): Payer: Self-pay | Admitting: *Deleted

## 2013-04-21 ENCOUNTER — Inpatient Hospital Stay (HOSPITAL_COMMUNITY): Payer: Medicaid Other | Admitting: Anesthesiology

## 2013-04-21 ENCOUNTER — Encounter (HOSPITAL_COMMUNITY): Payer: Self-pay | Admitting: Anesthesiology

## 2013-04-21 DIAGNOSIS — IMO0001 Reserved for inherently not codable concepts without codable children: Secondary | ICD-10-CM

## 2013-04-21 LAB — RPR: RPR Ser Ql: NONREACTIVE

## 2013-04-21 LAB — CBC
HCT: 38.3 % (ref 36.0–46.0)
MCHC: 34.5 g/dL (ref 30.0–36.0)
Platelets: 147 10*3/uL — ABNORMAL LOW (ref 150–400)
RDW: 14 % (ref 11.5–15.5)

## 2013-04-21 MED ORDER — IBUPROFEN 600 MG PO TABS: 600.0000 mg | ORAL_TABLET | Freq: Four times a day (QID) | ORAL | Status: DC | PRN

## 2013-04-21 MED ORDER — ONDANSETRON HCL 4 MG/2ML IJ SOLN: 4.0000 mg | Freq: Four times a day (QID) | INTRAMUSCULAR | Status: DC | PRN

## 2013-04-21 MED ORDER — ACETAMINOPHEN 325 MG PO TABS: 650.0000 mg | ORAL_TABLET | ORAL | Status: DC | PRN

## 2013-04-21 MED ORDER — DIBUCAINE 1 % RE OINT: 1.0000 "application " | TOPICAL_OINTMENT | RECTAL | Status: DC | PRN

## 2013-04-21 MED ORDER — LACTATED RINGERS IV SOLN
INTRAVENOUS | Status: DC
  Administered 2013-04-21 (×2): via INTRAVENOUS

## 2013-04-21 MED ORDER — LANOLIN HYDROUS EX OINT: TOPICAL_OINTMENT | CUTANEOUS | Status: DC | PRN

## 2013-04-21 MED ORDER — PHENYLEPHRINE 40 MCG/ML (10ML) SYRINGE FOR IV PUSH (FOR BLOOD PRESSURE SUPPORT)
80.0000 ug | PREFILLED_SYRINGE | INTRAVENOUS | Status: DC | PRN
  Filled 2013-04-21: qty 2

## 2013-04-21 MED ORDER — OXYCODONE-ACETAMINOPHEN 5-325 MG PO TABS
1.0000 | ORAL_TABLET | ORAL | Status: DC | PRN
  Administered 2013-04-21 – 2013-04-22 (×2): 1 via ORAL
  Filled 2013-04-21 (×2): qty 1

## 2013-04-21 MED ORDER — DIPHENHYDRAMINE HCL 25 MG PO CAPS: 25.0000 mg | ORAL_CAPSULE | Freq: Four times a day (QID) | ORAL | Status: DC | PRN

## 2013-04-21 MED ORDER — ONDANSETRON HCL 4 MG PO TABS: 4.0000 mg | ORAL_TABLET | ORAL | Status: DC | PRN

## 2013-04-21 MED ORDER — LACTATED RINGERS IV SOLN
500.0000 mL | Freq: Once | INTRAVENOUS | Status: AC
  Administered 2013-04-21: 500 mL via INTRAVENOUS

## 2013-04-21 MED ORDER — FLEET ENEMA 7-19 GM/118ML RE ENEM: 1.0000 | ENEMA | RECTAL | Status: DC | PRN

## 2013-04-21 MED ORDER — BUTORPHANOL TARTRATE 1 MG/ML IJ SOLN
1.0000 mg | INTRAMUSCULAR | Status: DC | PRN
  Administered 2013-04-21 (×2): 1 mg via INTRAVENOUS
  Filled 2013-04-21 (×2): qty 1

## 2013-04-21 MED ORDER — MEASLES, MUMPS & RUBELLA VAC ~~LOC~~ INJ
0.5000 mL | INJECTION | Freq: Once | SUBCUTANEOUS | Status: DC
  Filled 2013-04-21: qty 0.5

## 2013-04-21 MED ORDER — FENTANYL 2.5 MCG/ML BUPIVACAINE 1/10 % EPIDURAL INFUSION (WH - ANES)
14.0000 mL/h | INTRAMUSCULAR | Status: DC | PRN
  Administered 2013-04-21: 14 mL/h via EPIDURAL
  Filled 2013-04-21: qty 125

## 2013-04-21 MED ORDER — OXYTOCIN 40 UNITS IN LACTATED RINGERS INFUSION - SIMPLE MED
62.5000 mL/h | INTRAVENOUS | Status: DC
  Administered 2013-04-21: 62.5 mL/h via INTRAVENOUS
  Filled 2013-04-21: qty 1000

## 2013-04-21 MED ORDER — PRENATAL MULTIVITAMIN CH
1.0000 | ORAL_TABLET | Freq: Every day | ORAL | Status: DC
  Administered 2013-04-21 – 2013-04-22 (×2): 1 via ORAL
  Filled 2013-04-21 (×2): qty 1

## 2013-04-21 MED ORDER — EPHEDRINE 5 MG/ML INJ
10.0000 mg | INTRAVENOUS | Status: DC | PRN
  Filled 2013-04-21: qty 2
  Filled 2013-04-21: qty 4

## 2013-04-21 MED ORDER — SODIUM BICARBONATE 8.4 % IV SOLN
INTRAVENOUS | Status: DC | PRN
  Administered 2013-04-21: 5 mL via EPIDURAL

## 2013-04-21 MED ORDER — OXYTOCIN BOLUS FROM INFUSION: 500.0000 mL | INTRAVENOUS | Status: DC

## 2013-04-21 MED ORDER — BENZOCAINE-MENTHOL 20-0.5 % EX AERO
1.0000 "application " | INHALATION_SPRAY | CUTANEOUS | Status: DC | PRN
  Filled 2013-04-21: qty 56

## 2013-04-21 MED ORDER — DIPHENHYDRAMINE HCL 50 MG/ML IJ SOLN: 12.5000 mg | INTRAMUSCULAR | Status: DC | PRN

## 2013-04-21 MED ORDER — FERROUS SULFATE 325 (65 FE) MG PO TABS
325.0000 mg | ORAL_TABLET | Freq: Two times a day (BID) | ORAL | Status: DC
  Administered 2013-04-21 – 2013-04-22 (×2): 325 mg via ORAL
  Filled 2013-04-21 (×2): qty 1

## 2013-04-21 MED ORDER — LIDOCAINE HCL (PF) 1 % IJ SOLN
30.0000 mL | INTRAMUSCULAR | Status: DC | PRN
  Administered 2013-04-21: 30 mL via SUBCUTANEOUS
  Filled 2013-04-21 (×2): qty 30

## 2013-04-21 MED ORDER — ZOLPIDEM TARTRATE 5 MG PO TABS: 5.0000 mg | ORAL_TABLET | Freq: Every evening | ORAL | Status: DC | PRN

## 2013-04-21 MED ORDER — IBUPROFEN 600 MG PO TABS
600.0000 mg | ORAL_TABLET | Freq: Four times a day (QID) | ORAL | Status: DC
  Administered 2013-04-21 – 2013-04-22 (×5): 600 mg via ORAL
  Filled 2013-04-21 (×5): qty 1

## 2013-04-21 MED ORDER — WITCH HAZEL-GLYCERIN EX PADS: 1.0000 "application " | MEDICATED_PAD | CUTANEOUS | Status: DC | PRN

## 2013-04-21 MED ORDER — TETANUS-DIPHTH-ACELL PERTUSSIS 5-2.5-18.5 LF-MCG/0.5 IM SUSP: 0.5000 mL | Freq: Once | INTRAMUSCULAR | Status: DC

## 2013-04-21 MED ORDER — OXYCODONE-ACETAMINOPHEN 5-325 MG PO TABS: 1.0000 | ORAL_TABLET | ORAL | Status: DC | PRN

## 2013-04-21 MED ORDER — ONDANSETRON HCL 4 MG/2ML IJ SOLN: 4.0000 mg | INTRAMUSCULAR | Status: DC | PRN

## 2013-04-21 MED ORDER — EPHEDRINE 5 MG/ML INJ
10.0000 mg | INTRAVENOUS | Status: DC | PRN
  Filled 2013-04-21: qty 2

## 2013-04-21 MED ORDER — CITRIC ACID-SODIUM CITRATE 334-500 MG/5ML PO SOLN: 30.0000 mL | ORAL | Status: DC | PRN

## 2013-04-21 MED ORDER — SENNOSIDES-DOCUSATE SODIUM 8.6-50 MG PO TABS
2.0000 | ORAL_TABLET | Freq: Every day | ORAL | Status: DC
  Administered 2013-04-21: 2 via ORAL

## 2013-04-21 MED ORDER — LACTATED RINGERS IV SOLN: 500.0000 mL | INTRAVENOUS | Status: DC | PRN

## 2013-04-21 MED ORDER — MAGNESIUM HYDROXIDE 400 MG/5ML PO SUSP: 30.0000 mL | ORAL | Status: DC | PRN

## 2013-04-21 MED ORDER — MEDROXYPROGESTERONE ACETATE 150 MG/ML IM SUSP: 150.0000 mg | INTRAMUSCULAR | Status: DC | PRN

## 2013-04-21 MED ORDER — PHENYLEPHRINE 40 MCG/ML (10ML) SYRINGE FOR IV PUSH (FOR BLOOD PRESSURE SUPPORT)
80.0000 ug | PREFILLED_SYRINGE | INTRAVENOUS | Status: DC | PRN
  Filled 2013-04-21: qty 2
  Filled 2013-04-21: qty 5

## 2013-04-21 MED ORDER — MISOPROSTOL 25 MCG QUARTER TABLET
75.0000 ug | ORAL_TABLET | Freq: Once | ORAL | Status: AC
  Administered 2013-04-21: 75 ug via ORAL
  Filled 2013-04-21: qty 0.75

## 2013-04-21 NOTE — Progress Notes (Signed)
Annabelle Harman RN in [facility] given report. PT to BS via w.c

## 2013-04-21 NOTE — H&P (Signed)
Joanna Mcpherson is a 27 y.o. female presenting for contractions. Maternal Medical History:  Reason for admission: Contractions.   Contractions: Frequency: regular.   Perceived severity is strong.    Fetal activity: Perceived fetal activity is normal.    Prenatal complications: no prenatal complications Prenatal Complications - Diabetes: none.    OB History   Grav Para Term Preterm Abortions TAB SAB Ect Mult Living   3 1 1  1 1    1      Past Medical History  Diagnosis Date  . Asthma   . Hx of migraines   . Abnormal Pap smear   . Headache    Past Surgical History  Procedure Laterality Date  . Ankle fracture surgery    . Elective abortion    . Cervical cone biopsy     Family History: family history includes Diabetes in her mother. Social History:  reports that she quit smoking about 10 months ago. Her smoking use included Cigarettes. She has a .5 pack-year smoking history. She has never used smokeless tobacco. She reports that she does not drink alcohol or use illicit drugs.     Review of Systems  Constitutional: Negative for fever.  Eyes: Negative for blurred vision.  Respiratory: Negative for shortness of breath.   Gastrointestinal: Negative for vomiting.  Skin: Negative for rash.  Neurological: Negative for headaches.    Dilation: 7 Effacement (%): 100 Station: -1 Exam by:: A. Tuttle, RNC Blood pressure 114/77, pulse 86, temperature 98.1 F (36.7 C), temperature source Oral, resp. rate 20, height 5\' 5"  (1.651 m), weight 208 lb (94.348 kg), last menstrual period 07/22/2012, SpO2 100.00%. Maternal Exam:  Uterine Assessment: Contraction frequency is regular.   Abdomen: not evaluated.  Introitus: not evaluated.   Cervix: Cervix evaluated by digital exam.     Fetal Exam Fetal Monitor Review: Variability: moderate (6-25 bpm).   Pattern: accelerations present and no decelerations.    Fetal State Assessment: Category I - tracings are normal.     Physical  Exam  Constitutional: She appears well-developed.  HENT:  Head: Normocephalic.  Neck: Neck supple. No thyromegaly present.  Cardiovascular: Normal rate and regular rhythm.   Respiratory: Breath sounds normal.  GI: Soft. Bowel sounds are normal.  Skin: No rash noted.    Prenatal labs: ABO, Rh: O/Positive/-- (10/11 0000) Antibody: Negative (10/11 0000) Rubella: Immune (10/11 0000) RPR: Nonreactive (01/30 0000)  HBsAg: Negative (10/11 0000)  HIV: Non-reactive (01/30 0000)  GBS: Negative (04/07 0000)   Assessment/Plan: Primipara at term, active labor, Category 1 FHT Admit, anticipate an NSVD   JACKSON-MOORE,[name] 04/21/2013, 7:53 AM

## 2013-04-21 NOTE — Progress Notes (Signed)
Update called to Dr Tamela Oddi as to reck. Will admit to BS.

## 2013-04-21 NOTE — Anesthesia Procedure Notes (Signed)
Epidural Patient location during procedure: OB  Preanesthetic Checklist Completed: patient identified, site marked, surgical consent, pre-op evaluation, timeout performed, IV checked, risks and benefits discussed and monitors and equipment checked  Epidural Patient position: sitting Prep: site prepped and draped and DuraPrep Patient monitoring: continuous pulse ox and blood pressure Approach: midline Injection technique: LOR air  Needle:  Needle type: Tuohy  Needle gauge: 17 G Needle length: 9 cm and 9 Needle insertion depth: 6 cm Catheter type: closed end flexible Catheter size: 19 Gauge Catheter at skin depth: 13 cm Test dose: negative  Assessment Events: blood not aspirated, injection not painful, no injection resistance, negative IV test and no paresthesia  Additional Notes Dosing of Epidural:  1st dose, through catheter ............................................. epi 1:200K + Xylocaine 40 mg  2nd dose, through catheter, after waiting 3 minutes.....epi 1:200K + Xylocaine 60 mg    ( 2% Xylo charted as a single dose in Epic Meds for ease of charting; actual dosing was fractionated as above, for saftey's sake)  As each dose occurred, patient was free of IV sx; and patient exhibited no evidence of SA injection.  Patient is more comfortable after epidural dosed. Please see RN's note for documentation of vital signs,and FHR which are stable.  Patient reminded not to try to ambulate with numb legs, and that an RN must be present when she attempts to get up.       

## 2013-04-21 NOTE — MAU Note (Signed)
Contractions since 2100 

## 2013-04-21 NOTE — Progress Notes (Signed)
Dr Tamela Oddi notified of pt's admission and status. Will reck in an hour.

## 2013-04-21 NOTE — Anesthesia Preprocedure Evaluation (Addendum)
Anesthesia Evaluation  Patient identified by MRN, date of birth, ID band Patient awake    Reviewed: Allergy & Precautions, H&P , Patient's Chart, lab work & pertinent test results  Airway Mallampati: II  TM Distance: >3 FB Neck ROM: full    Dental  (+) Teeth Intact   Pulmonary asthma ,    breath sounds clear to auscultation       Cardiovascular  Rhythm:regular Rate:Normal     Neuro/Psych    GI/Hepatic   Endo/Other    Renal/GU      Musculoskeletal   Abdominal   Peds  Hematology   Anesthesia Other Findings       Reproductive/Obstetrics (+) Pregnancy                            Anesthesia Physical Anesthesia Plan  ASA: III  Anesthesia Plan: Epidural   Post-op Pain Management:    Induction:   Airway Management Planned:   Additional Equipment:   Intra-op Plan:   Post-operative Plan:   Informed Consent: I have reviewed the patients History and Physical, chart, labs and discussed the procedure including the risks, benefits and alternatives for the proposed anesthesia with the patient or authorized representative who has indicated his/her understanding and acceptance.   Dental Advisory Given  Plan Discussed with:   Anesthesia Plan Comments: (Labs checked- platelets confirmed with RN in room. Fetal heart tracing, per RN, reported to be stable enough for sitting procedure. Discussed epidural, and patient consents to the procedure:  included risk of possible headache,backache, failed block, allergic reaction, and nerve injury. This patient was asked if she had any questions or concerns before the procedure started.)        Anesthesia Quick Evaluation  

## 2013-04-22 MED ORDER — OXYCODONE-ACETAMINOPHEN 5-325 MG PO TABS: 1.0000 | ORAL_TABLET | ORAL | Status: DC | PRN

## 2013-04-22 NOTE — Discharge Summary (Signed)
  Obstetric Discharge Summary Reason for Admission: onset of labor Prenatal Procedures: none Intrapartum Procedures: spontaneous vaginal delivery Postpartum Procedures: none Complications-Operative and Postpartum: none  Hemoglobin  Date Value Range Status  04/21/2013 13.2  12.0 - 15.0 g/dL Final     HCT  Date Value Range Status  04/21/2013 38.3  36.0 - 46.0 % Final    Physical Exam:  General: alert Lochia: appropriate Uterine: firm Incision: n/Mcpherson DVT Evaluation: No evidence of DVT seen on physical exam.  Discharge Diagnoses: Active Problems:   Active labor   Normal delivery   Discharge Information: Date: 04/22/2013 Activity: pelvic rest Diet: routine Medications:  Prior to Admission medications   Medication Sig Start Date End Date Taking? Authorizing Provider  butalbital-acetaminophen-caffeine (FIORICET, ESGIC) 50-325-40 MG per tablet Take 1 tablet by mouth 2 (two) times daily as needed for headache.   Yes Historical Provider, MD  loratadine (CLARITIN) 10 MG tablet Take 10 mg by mouth daily.   Yes Historical Provider, MD  Prenatal Vit-Fe Fumarate-FA (PRENATAL MULTIVITAMIN) TABS Take 1 tablet by mouth daily at 12 noon.   Yes Historical Provider, MD  oxyCODONE-acetaminophen (PERCOCET/ROXICET) 5-325 MG per tablet Take 1-2 tablets by mouth every 4 (four) hours as needed. 04/22/13   Joanna Char, MD    Condition: stable Instructions: refer to routine discharge instructions Discharge to: home Follow-up Information   Follow up with Joanna A, MD. Schedule an appointment as soon as possible for Mcpherson visit in 2 weeks.   Contact information:   [address] [place] [address] [phone]       Newborn Data: Live born  Information for the patient's newborn:  Joanna Mcpherson [[id number]]  female ; APGAR (1 MIN): 9   APGAR (5 MINS): 9    Home with mother.  Joanna Mcpherson 04/22/2013, 8:07 AM

## 2013-04-22 NOTE — Progress Notes (Signed)
UR chart review completed.  

## 2013-04-22 NOTE — Anesthesia Postprocedure Evaluation (Signed)
  Anesthesia Post-op Note  Patient: Joanna Mcpherson  Procedure(s) Performed: * No procedures listed *  Patient Location: PACU and Mother/Baby  Anesthesia Type:Epidural  Level of Consciousness: awake, alert  and oriented  Airway and Oxygen Therapy: Patient Spontanous Breathing  Post-op Pain: none  Post-op Assessment: Post-op Vital signs reviewed  Post-op Vital Signs: Reviewed and stable  Complications: No apparent anesthesia complications

## 2013-04-25 ENCOUNTER — Encounter: Payer: Medicaid Other | Admitting: Obstetrics

## 2013-05-15 ENCOUNTER — Encounter: Payer: Self-pay | Admitting: Obstetrics

## 2013-05-15 ENCOUNTER — Ambulatory Visit (INDEPENDENT_AMBULATORY_CARE_PROVIDER_SITE_OTHER): Payer: Medicaid Other | Admitting: Obstetrics

## 2013-05-15 VITALS — BP 111/79 | HR 100 | Temp 98.6°F | Ht 65.0 in | Wt 190.6 lb

## 2013-05-15 DIAGNOSIS — Z3009 Encounter for other general counseling and advice on contraception: Secondary | ICD-10-CM

## 2013-05-15 DIAGNOSIS — Z3202 Encounter for pregnancy test, result negative: Secondary | ICD-10-CM

## 2013-05-15 LAB — POCT URINE PREGNANCY: Preg Test, Ur: NEGATIVE

## 2013-05-15 NOTE — Progress Notes (Signed)
.   Subjective:     Joanna Mcpherson is a 27 y.o. female who presents for a postpartum visit. She is 3 weeks postpartum following a spontaneous vaginal delivery. I have fully reviewed the prenatal and intrapartum course. The delivery was at 39 gestational weeks. Outcome: spontaneous vaginal delivery. Anesthesia: epidural. Postpartum course has been normal. Baby's course has been normal. Baby is feeding by bottle Rush Barer. Bleeding no bleeding. Bowel function is normal. Bladder function is normal. Patient is not sexually active. Contraception method is none. Postpartum depression screening: negative.  The following portions of the patient's history were reviewed and updated as appropriate: allergies, current medications, past family history, past medical history, past social history, past surgical history and problem list.  Review of Systems Pertinent items are noted in HPI.   Objective:    BP 111/79  Pulse 100  Temp(Src) 98.6 F (37 C) (Oral)  Ht 5\' 5"  (1.651 m)  Wt 190 lb 9.6 oz (86.456 kg)  BMI 31.72 kg/m2  Breastfeeding? No  General:  alert and no distress   Breasts:  inspection negative, no nipple discharge or bleeding, no masses or nodularity palpable  Lungs: not examined  Heart:  not examined  Abdomen: normal findings: soft, non-tender   Vulva:  normal  Vagina: normal vagina  Cervix:  no lesions  Corpus: normal size, contour, position, consistency, mobility, non-tender  Adnexa:  no mass, fullness, tenderness  Rectal Exam: Not performed.        Assessment:     Normal postpartum exam. Pap smear not done at today's visit.   Plan:    1. Contraception: abstinence 2. Wants Nexplanon. 3. Follow up in: With menses for Nexplanon or as needed.

## 2013-05-29 ENCOUNTER — Ambulatory Visit (INDEPENDENT_AMBULATORY_CARE_PROVIDER_SITE_OTHER): Payer: Medicaid Other | Admitting: Obstetrics

## 2013-05-29 ENCOUNTER — Encounter: Payer: Self-pay | Admitting: Obstetrics

## 2013-05-29 VITALS — BP 124/80 | HR 111 | Temp 99.3°F | Wt 194.0 lb

## 2013-05-29 DIAGNOSIS — Z30017 Encounter for initial prescription of implantable subdermal contraceptive: Secondary | ICD-10-CM

## 2013-05-29 DIAGNOSIS — Z3009 Encounter for other general counseling and advice on contraception: Secondary | ICD-10-CM

## 2013-05-29 MED ORDER — ETONOGESTREL 68 MG ~~LOC~~ IMPL: 68.0000 mg | DRUG_IMPLANT | Freq: Once | SUBCUTANEOUS | Status: DC

## 2013-05-29 NOTE — Progress Notes (Signed)
NEXPLANON INSERTION NOTE INSERTION RESCHEDULED.  PATIENT HAD UNPROTECTED SEX 1 WEEK AGO.

## 2013-07-01 ENCOUNTER — Ambulatory Visit (INDEPENDENT_AMBULATORY_CARE_PROVIDER_SITE_OTHER): Payer: Medicaid Other | Admitting: Obstetrics

## 2013-07-01 ENCOUNTER — Encounter: Payer: Self-pay | Admitting: Obstetrics

## 2013-07-01 VITALS — BP 111/79 | HR 77 | Temp 98.4°F | Wt 200.0 lb

## 2013-07-01 DIAGNOSIS — Z30017 Encounter for initial prescription of implantable subdermal contraceptive: Secondary | ICD-10-CM

## 2013-07-01 DIAGNOSIS — Z309 Encounter for contraceptive management, unspecified: Secondary | ICD-10-CM

## 2013-07-01 DIAGNOSIS — Z3202 Encounter for pregnancy test, result negative: Secondary | ICD-10-CM

## 2013-07-01 NOTE — Progress Notes (Signed)
NEXPLANON INSERTION NOTE  Date of LMP:   06/28/2013   Contraception used: condoms  Pregnancy test result:  Lab Results  Component Value Date   PREGTESTUR Negative 07/01/2013    Indications:  The patient desires contraception.  She understands risks, benefits, and alternatives to Implanon and would like to proceed.  Anesthesia:   Lidocaine 1% plain.  Procedure:  A time-out was performed confirming the procedure and the patient's allergy status.  The patient's non-dominant was identified as the left arm.  The protection cap was removed. While placing countertraction on the skin, the needle was inserted at a 30 degree angle.  The applicator was held horizontal to the skin; the skin was tented upward as the needle was introduced into the subdermal space.  While holding the applicator in place, the slider was unlocked. The Nexplanon was removed from the field.  The Nexplanon was palpated to ensure proper placement.  Complications: None  Instructions:  The patient was instructed to remove the dressing in 24 hours and that some bruising is to be expected.  She was advised to use over the counter analgesics as needed for any pain at the site.  She is to keep the area dry for 24 hours and to call if her hand or arm becomes cold, numb, or blue.  Return visit:  Return in 2 weeks

## 2013-07-16 ENCOUNTER — Encounter: Payer: Self-pay | Admitting: Obstetrics

## 2013-07-16 ENCOUNTER — Ambulatory Visit (INDEPENDENT_AMBULATORY_CARE_PROVIDER_SITE_OTHER): Payer: Medicaid Other | Admitting: Obstetrics

## 2013-07-16 VITALS — BP 115/77 | HR 97 | Temp 99.3°F | Wt 196.0 lb

## 2013-07-16 DIAGNOSIS — Z3046 Encounter for surveillance of implantable subdermal contraceptive: Secondary | ICD-10-CM

## 2013-07-16 NOTE — Progress Notes (Signed)
Subjective:     Joanna Mcpherson is a 27 y.o. female here for a routine exam.  Current complaints: follow up to Nexplanon Insertion. Pt states she had her Nexplanon inserted about 2 weeks ago. Pt has no complaints at this time.  Personal health questionnaire reviewed: yes.   Gynecologic History Patient's last menstrual period was 06/28/2013. Contraception: Nexplanon   Obstetric History OB History   Grav Para Term Preterm Abortions TAB SAB Ect Mult Living   3 2 2  1 1    2      # Outc Date GA Lbr Len/2nd Wgt Sex Del Anes PTL Lv   1 TRM 5/14 [redacted]w[redacted]d 10:48 / 00:23  M SVD EPI  Yes   Comments: none   2 TAB            3 TRM     M SVD   Yes       The following portions of the patient's history were reviewed and updated as appropriate: allergies, current medications, past family history, past medical history, past social history, past surgical history and problem list.  Review of Systems Pertinent items are noted in HPI.    Objective:    Extremities: Nexplanon insertion site clean and nontender.  Rod palpated, intact.    Assessment:    Nexplanon surveillance.   Plan:    Contraception: Nexplanon. Follow up in: several months.

## 2013-07-17 ENCOUNTER — Encounter: Payer: Self-pay | Admitting: Obstetrics

## 2014-10-20 ENCOUNTER — Encounter: Payer: Self-pay | Admitting: Obstetrics

## 2015-07-13 ENCOUNTER — Telehealth: Payer: Self-pay | Admitting: *Deleted

## 2015-07-13 NOTE — Telephone Encounter (Signed)
Patient is interested in having her Nexplanon removed. Patient states she is having unbearable migraines and tingling in her left arm. Patient also has complaints of periods with severe cramps. Patient had her nexplanon inserted in 2014. Patient has been scheduled for a Nexplanon removal on 07-15-15.

## 2015-07-15 ENCOUNTER — Encounter: Payer: Self-pay | Admitting: Obstetrics

## 2015-07-15 ENCOUNTER — Ambulatory Visit (INDEPENDENT_AMBULATORY_CARE_PROVIDER_SITE_OTHER): Payer: BLUE CROSS/BLUE SHIELD | Admitting: Obstetrics

## 2015-07-15 VITALS — BP 123/71 | HR 86 | Temp 97.6°F | Wt 220.0 lb

## 2015-07-15 DIAGNOSIS — Z308 Encounter for other contraceptive management: Secondary | ICD-10-CM | POA: Diagnosis not present

## 2015-07-15 DIAGNOSIS — Z3046 Encounter for surveillance of implantable subdermal contraceptive: Secondary | ICD-10-CM

## 2015-07-15 NOTE — Progress Notes (Signed)

## 2015-07-30 ENCOUNTER — Ambulatory Visit (INDEPENDENT_AMBULATORY_CARE_PROVIDER_SITE_OTHER): Payer: BLUE CROSS/BLUE SHIELD | Admitting: Obstetrics

## 2015-07-30 ENCOUNTER — Encounter: Payer: Self-pay | Admitting: Obstetrics

## 2015-07-30 VITALS — BP 130/81 | HR 95 | Temp 98.5°F | Ht 65.0 in | Wt 223.0 lb

## 2015-07-30 DIAGNOSIS — Z3009 Encounter for other general counseling and advice on contraception: Secondary | ICD-10-CM | POA: Diagnosis not present

## 2015-07-30 DIAGNOSIS — Z308 Encounter for other contraceptive management: Secondary | ICD-10-CM | POA: Diagnosis not present

## 2015-07-30 DIAGNOSIS — Z3046 Encounter for surveillance of implantable subdermal contraceptive: Secondary | ICD-10-CM

## 2015-07-30 NOTE — Progress Notes (Signed)
   Subjective:        Joanna Mcpherson is a 29 y.o. female here for a 2 week check up after Nexplanon removal.  Current complaints: none.   Review of Systems  Constitutional: negative for fatigue and weight loss Respiratory: negative for cough and wheezing Cardiovascular: negative for chest pain, fatigue and palpitations Gastrointestinal: negative for abdominal pain and change in bowel habits Musculoskeletal:negative for myalgias Neurological: negative for gait problems and tremors Behavioral/Psych: negative for abusive relationship, depression Endocrine: negative for temperature intolerance   Genitourinary:negative for abnormal menstrual periods, genital lesions, hot flashes, sexual problems and vaginal discharge Integument/breast: negative for breast lump, breast tenderness, nipple discharge and skin lesion(s)    Objective:   PE:        General:  Alert and no distress      Left upper arm:  Nexplanon removal site clean, dry and intact.  Non tender.  Suture removed.           BP 130/81 mmHg  Pulse 95  Temp(Src) 98.5 F (36.9 C)  Ht  (1.651 m)  Wt 223 lb (101.152 kg)  BMI 37.11 kg/m2      Assessment:    2 week check after Nexplanon removal.  Doing well.  Removal site healing well.   Plan:    Education reviewed: safe sex/STD prevention. Contraception: options discussed. Follow up in: 2 months.   No orders of the defined types were placed in this encounter.   No orders of the defined types were placed in this encounter.

## 2015-08-25 ENCOUNTER — Telehealth: Payer: Self-pay | Admitting: *Deleted

## 2015-08-25 NOTE — Telephone Encounter (Signed)
Patient states she has not had a cycle this month- she had her Nexplanon removed a month ago and spotted a couple days after removal- but no cycle since.Patient is not preventing pregnancy- but UPT yesterday was negative. Told patient to wait 2 weeks and to repeat UPT while using BUM . If no pregnancy Dr Clearance Coots can give her something to jump start a cycle for her- but she can not be pregnant. Stressed the importance of BUM in the next 2 weeks and patient understands.

## 2015-09-02 ENCOUNTER — Other Ambulatory Visit: Payer: Self-pay | Admitting: Obstetrics

## 2015-09-02 ENCOUNTER — Telehealth: Payer: Self-pay | Admitting: *Deleted

## 2015-09-02 DIAGNOSIS — N912 Amenorrhea, unspecified: Secondary | ICD-10-CM

## 2015-09-02 MED ORDER — MEDROXYPROGESTERONE ACETATE 10 MG PO TABS: 10.0000 mg | ORAL_TABLET | Freq: Every day | ORAL | Status: DC

## 2015-09-02 NOTE — Telephone Encounter (Signed)
Provera Rx 

## 2015-09-02 NOTE — Telephone Encounter (Signed)
Patient states she has still not started her cycle and would like something to start her cycle- all of her UPT test have been negative. 4:06 LM on VM- will get Joanna Mcpherson to send something in for her as previously discussed. Make sure she has been using back up method as discussed and she can follow instructions to jump start a cycle.

## 2015-11-24 ENCOUNTER — Emergency Department (HOSPITAL_COMMUNITY): Payer: BLUE CROSS/BLUE SHIELD

## 2015-11-24 ENCOUNTER — Encounter (HOSPITAL_COMMUNITY): Payer: Self-pay

## 2015-11-24 ENCOUNTER — Encounter (HOSPITAL_BASED_OUTPATIENT_CLINIC_OR_DEPARTMENT_OTHER): Payer: Self-pay | Admitting: Emergency Medicine

## 2015-11-24 ENCOUNTER — Emergency Department (HOSPITAL_COMMUNITY)
Admission: EM | Admit: 2015-11-24 | Discharge: 2015-11-24 | Disposition: A | Discharge status: Home / Self Care | Payer: BLUE CROSS/BLUE SHIELD | Attending: Emergency Medicine | Admitting: Emergency Medicine

## 2015-11-24 DIAGNOSIS — Z87891 Personal history of nicotine dependence: Secondary | ICD-10-CM | POA: Insufficient documentation

## 2015-11-24 DIAGNOSIS — J45909 Unspecified asthma, uncomplicated: Secondary | ICD-10-CM | POA: Insufficient documentation

## 2015-11-24 DIAGNOSIS — J029 Acute pharyngitis, unspecified: Secondary | ICD-10-CM | POA: Diagnosis not present

## 2015-11-24 DIAGNOSIS — G43909 Migraine, unspecified, not intractable, without status migrainosus: Secondary | ICD-10-CM | POA: Diagnosis not present

## 2015-11-24 DIAGNOSIS — R05 Cough: Secondary | ICD-10-CM | POA: Diagnosis present

## 2015-11-24 LAB — RAPID STREP SCREEN (MED CTR MEBANE ONLY): STREPTOCOCCUS, GROUP A SCREEN (DIRECT): NEGATIVE

## 2015-11-24 MED ORDER — NAPROXEN 500 MG PO TABS: 500.0000 mg | ORAL_TABLET | Freq: Two times a day (BID) | ORAL | Status: DC

## 2015-11-24 NOTE — Discharge Instructions (Signed)

## 2015-11-24 NOTE — ED Notes (Signed)
Per pt, sore throat and headache since yesterday.  Cannot get headache to go away.  Coughed this morning and had discolored sputum.  Took picture.  Occurrence x 1.  Unknown for fever but left work yesterday b/c she felt warm.  Pt took tylenol yesterday.  Sore throat.  Hurts to swallow.  Denies strong cough.

## 2015-11-24 NOTE — ED Provider Notes (Signed)
CSN: [id number]     Arrival date & time 11/24/15  0736 History   First MD Initiated Contact with Patient 11/24/15 0755     Chief Complaint  Patient presents with  . Sore Throat  . Headache    Patient is a 29 y.o. female presenting with pharyngitis and headaches. The history is provided by the patient.  Sore Throat This is a new problem. The current episode started yesterday. The problem occurs constantly. The problem has not changed since onset.Associated symptoms include headaches. Pertinent negatives include no chest pain, no abdominal pain and no shortness of breath.  Headache Associated symptoms: cough (just once this am and noticed bloody sputum) and sore throat   Associated symptoms: no abdominal pain, no diarrhea, no fever and no vomiting     Past Medical History  Diagnosis Date  . Asthma   . Hx of migraines   . Abnormal Pap smear   . WGNFAOZH(086.5)    Past Surgical History  Procedure Laterality Date  . Ankle fracture surgery    . Elective abortion    . Cervical cone biopsy     Family History  Problem Relation Age of Onset  . Diabetes Mother    Social History  Substance Use Topics  . Smoking status: Former Smoker -- 0.25 packs/day for 2 years    Types: Cigarettes    Quit date: 06/17/2012  . Smokeless tobacco: Never Used  . Alcohol Use: 0.0 oz/week    0 Standard drinks or equivalent per week     Comment: rare wine drinker   OB History    Gravida Para Term Preterm AB TAB SAB Ectopic Multiple Living   Review of Systems  Constitutional: Negative for fever.  HENT: Positive for sore throat. Negative for nosebleeds and trouble swallowing.   Respiratory: Positive for cough (just once this am and noticed bloody sputum). Negative for shortness of breath.   Cardiovascular: Negative for chest pain.  Gastrointestinal: Negative for vomiting, abdominal pain and diarrhea.  Genitourinary: Negative for dysuria.  Neurological: Positive for headaches.       Allergies  Review of patient's allergies indicates no known allergies.  Home Medications   Prior to Admission medications   Medication Sig Start Date End Date Taking? Authorizing Provider  acetaminophen (TYLENOL) 500 MG tablet Take 1,000 mg by mouth every 6 (six) hours as needed for moderate pain or headache.   Yes Historical Provider, MD  aspirin-acetaminophen-caffeine (EXCEDRIN MIGRAINE) [phone] MG per tablet Take by mouth every 6 (six) hours as needed for headache.   Yes Historical Provider, MD  butalbital-acetaminophen-caffeine (FIORICET, ESGIC) 50-325-40 MG per tablet Take 1 tablet by mouth 2 (two) times daily as needed for headache.   Yes Historical Provider, MD  diphenhydrAMINE (BENADRYL) 12.5 MG chewable tablet Chew 12.5 mg by mouth 4 (four) times daily as needed for allergies.   Yes Historical Provider, MD  medroxyPROGESTERone (PROVERA) 10 MG tablet Take 1 tablet (10 mg total) by mouth daily. Patient not taking: Reported on 11/24/2015 09/02/15   Brock Bad, MD  naproxen (NAPROSYN) 500 MG tablet Take 1 tablet (500 mg total) by mouth 2 (two) times daily with a meal. As needed for pain 11/24/15   Linwood Dibbles, MD   BP 128/81 mmHg  Pulse 94  Temp(Src) 99 F (37.2 C) (Oral)  Resp 18  SpO2 100% Physical Exam  Constitutional: She appears well-developed and well-nourished. No distress.  HENT:  Head: Normocephalic and atraumatic.  Right Ear: External ear normal.  Left Ear: External ear normal.  Mouth/Throat: No oropharyngeal exudate.  No pharyngeal erythema or bleeding, no epistaxis  Eyes: Conjunctivae are normal. Right eye exhibits no discharge. Left eye exhibits no discharge. No scleral icterus.  Neck: Neck supple. No tracheal deviation present.  Cardiovascular: Normal rate, regular rhythm and intact distal pulses.   Pulmonary/Chest: Effort normal and breath sounds normal. No stridor. No respiratory distress. She has no wheezes. She has no rales.  Abdominal: Soft.  Bowel sounds are normal. She exhibits no distension. There is no tenderness. There is no rebound and no guarding.  Musculoskeletal: She exhibits no edema or tenderness.  Neurological: She is alert. She has normal strength. No cranial nerve deficit (no facial droop, extraocular movements intact, no slurred speech) or sensory deficit. She exhibits normal muscle tone. She displays no seizure activity. Coordination normal.  Skin: Skin is warm and dry. No rash noted.  Psychiatric: She has a normal mood and affect.  Nursing note and vitals reviewed.   ED Course  Procedures (including critical care time) Labs Review Labs Reviewed  RAPID STREP SCREEN (NOT AT [facility])  CULTURE, GROUP A STREP    Imaging Review Dg Chest 2 View  11/24/2015  CLINICAL DATA:  One episode of cough with blood tinged sputum. Sore throat and headache for 1 day. EXAM: CHEST  2 VIEW COMPARISON:  None. FINDINGS: Lungs are clear. Heart size and pulmonary vascularity are normal. No adenopathy. No bone lesions. IMPRESSION: No edema or consolidation. Electronically Signed   By: Bretta BangWilliam  Woodruff III M.D.   On: 11/24/2015 08:28   I have personally reviewed and evaluated these images and lab results as part of my medical decision-making.   MDM   Final diagnoses:  Pharyngitis    The patient did show me a picture of some bloody-appearing sputum. She has only had one episode this morning. She has not had any further episodes. I doubt that this is related to pulmonary embolism or any significant pulmonary hemorrhage. Chest x-ray is normal.  Symptoms may be related to a viral syndrome and associated pharyngitis. Strep screen is negative.  Discussed outpatient follow-up. Supportive care.    Linwood DibblesJon Yee Gangi, MD 11/24/15 [phone]

## 2015-11-26 LAB — CULTURE, GROUP A STREP: Strep A Culture: NEGATIVE

## 2016-05-17 ENCOUNTER — Ambulatory Visit (INDEPENDENT_AMBULATORY_CARE_PROVIDER_SITE_OTHER): Payer: BLUE CROSS/BLUE SHIELD | Admitting: Obstetrics

## 2016-05-17 ENCOUNTER — Encounter: Payer: Self-pay | Admitting: Obstetrics

## 2016-05-17 VITALS — BP 117/84 | HR 93 | Temp 98.9°F | Ht 66.0 in | Wt 209.0 lb

## 2016-05-17 DIAGNOSIS — Z3009 Encounter for other general counseling and advice on contraception: Secondary | ICD-10-CM

## 2016-05-17 DIAGNOSIS — Z1389 Encounter for screening for other disorder: Secondary | ICD-10-CM

## 2016-05-17 DIAGNOSIS — N946 Dysmenorrhea, unspecified: Secondary | ICD-10-CM

## 2016-05-17 DIAGNOSIS — Z01419 Encounter for gynecological examination (general) (routine) without abnormal findings: Secondary | ICD-10-CM | POA: Diagnosis not present

## 2016-05-17 DIAGNOSIS — N898 Other specified noninflammatory disorders of vagina: Secondary | ICD-10-CM

## 2016-05-17 LAB — POCT URINALYSIS DIPSTICK
Bilirubin, UA: NEGATIVE
Blood, UA: NEGATIVE
Glucose, UA: NEGATIVE
Leukocytes, UA: NEGATIVE
Nitrite, UA: NEGATIVE
Protein, UA: NEGATIVE
Spec Grav, UA: <=1.005
Urobilinogen, UA: NEGATIVE
pH, UA: 8

## 2016-05-17 MED ORDER — HYDROCODONE-IBUPROFEN 7.5-200 MG PO TABS: 1.0000 | ORAL_TABLET | Freq: Three times a day (TID) | ORAL | Status: DC | PRN

## 2016-05-17 MED ORDER — IBUPROFEN 800 MG PO TABS: 800.0000 mg | ORAL_TABLET | Freq: Three times a day (TID) | ORAL | Status: DC | PRN

## 2016-05-17 NOTE — Addendum Note (Signed)
Addended by: Marya LandryFOSTER, Lauraann Missey D on: 05/17/2016 05:30 PM   Modules accepted: Orders

## 2016-05-17 NOTE — Progress Notes (Signed)
Subjective:        Joanna SmilesDanitra S Mcpherson is a 30 y.o. female here for a routine exam.  Current complaints: Severe cramping with periods on 1st 2 days of a 5 day cycle.    Personal health questionnaire:  Is patient Ashkenazi Jewish, have a family history of breast and/or ovarian cancer: no Is there a family history of uterine cancer diagnosed at age < 5050, gastrointestinal cancer, urinary tract cancer, family member who is a Personnel officerLynch syndrome-associated carrier: no Is the patient overweight and hypertensive, family history of diabetes, personal history of gestational diabetes, preeclampsia or PCOS: no Is patient over 2355, have PCOS,  family history of premature CHD under age 29, diabetes, smoke, have hypertension or peripheral artery disease:  no At any time, has a partner hit, kicked or otherwise hurt or frightened you?: no Over the past 2 weeks, have you felt down, depressed or hopeless?: no Over the past 2 weeks, have you felt little interest or pleasure in doing things?:no   Gynecologic History Patient's last menstrual period was 05/05/2016 (exact date). Contraception: condoms Last Pap: 2014. Results were: normal Last mammogram: n/a. Results were: n/a  Obstetric History OB History  Gravida Para Term Preterm AB SAB TAB Ectopic Multiple Living  3 2 2  1  1   2     # Outcome Date GA Lbr Len/2nd Weight Sex Delivery Anes PTL Lv  3 Term 04/21/13 [id number] 10:48 / 00:23  M Vag-Spont EPI  Y     Comments: none  2 Term     M Vag-Spont   Y  1 TAB               Past Medical History  Diagnosis Date  . Asthma   . Hx of migraines   . Abnormal Pap smear   . ZOXWRUEA(540.9Headache(784.0)     Past Surgical History  Procedure Laterality Date  . Ankle fracture surgery    . Elective abortion    . Cervical cone biopsy       Current outpatient prescriptions:  .  acetaminophen (TYLENOL) 500 MG tablet, Take 1,000 mg by mouth every 6 (six) hours as needed for moderate pain or headache., Disp: , Rfl:  .   aspirin-acetaminophen-caffeine (EXCEDRIN MIGRAINE) 250-250-65 MG per tablet, Take by mouth every 6 (six) hours as needed for headache., Disp: , Rfl:  .  butalbital-acetaminophen-caffeine (FIORICET, ESGIC) 50-325-40 MG per tablet, Take 1 tablet by mouth 2 (two) times daily as needed for headache., Disp: , Rfl:  .  diphenhydrAMINE (BENADRYL) 12.5 MG chewable tablet, Chew 12.5 mg by mouth 4 (four) times daily as needed for allergies., Disp: , Rfl:   Current facility-administered medications:  .  etonogestrel (IMPLANON) implant 68 mg, 68 mg, Subcutaneous, Once, Brock Badharles A Olly Shiner, MD No Known Allergies  Social History  Substance Use Topics  . Smoking status: Former Smoker -- 0.25 packs/day for 2 years    Types: Cigarettes    Quit date: 06/17/2012  . Smokeless tobacco: Never Used  . Alcohol Use: 0.0 oz/week    0 Standard drinks or equivalent per week     Comment: rare wine drinker    Family History  Problem Relation Age of Onset  . Diabetes Mother       Review of Systems  Constitutional: negative for fatigue and weight loss Respiratory: negative for cough and wheezing Cardiovascular: negative for chest pain, fatigue and palpitations Gastrointestinal: negative for abdominal pain and change in bowel habits Musculoskeletal:negative for myalgias Neurological:  negative for gait problems and tremors Behavioral/Psych: negative for abusive relationship, depression Endocrine: negative for temperature intolerance   Genitourinary:negative for abnormal menstrual periods, genital lesions, hot flashes, sexual problems and vaginal discharge Integument/breast: negative for breast lump, breast tenderness, nipple discharge and skin lesion(s)    Objective:       BP 117/84 mmHg  Pulse 93  Temp(Src) 98.9 F (37.2 C)  Ht  (1.676 m)  Wt 209 lb (94.802 kg)  BMI 33.75 kg/m2  LMP 05/05/2016 (Exact Date) General:   alert  Skin:   no rash or abnormalities  Lungs:   clear to auscultation  bilaterally  Heart:   regular rate and rhythm, S1, S2 normal, no murmur, click, rub or gallop  Breasts:   normal without suspicious masses, skin or nipple changes or axillary nodes  Abdomen:  normal findings: no organomegaly, soft, non-tender and no hernia  Pelvis:  External genitalia: normal general appearance Urinary system: urethral meatus normal and bladder without fullness, nontender Vaginal: normal without tenderness, induration or masses Cervix: normal appearance Adnexa: normal bimanual exam Uterus: anteverted and non-tender, normal size   Lab Review Urine pregnancy test Labs reviewed yes Radiologic studies reviewed no    Assessment:    Healthy female exam.    Dysmenorrhea  Contraceptive counseling and advice   Plan:    Education reviewed: calcium supplements, depression evaluation, low fat, low cholesterol diet, safe sex/STD prevention, self breast exams and weight bearing exercise. Contraception: condoms. Follow up in: 1 year.   No orders of the defined types were placed in this encounter.   No orders of the defined types were placed in this encounter.

## 2016-05-17 NOTE — Patient Instructions (Signed)

## 2016-05-20 ENCOUNTER — Other Ambulatory Visit: Payer: Self-pay | Admitting: Obstetrics

## 2016-05-20 DIAGNOSIS — B3731 Acute candidiasis of vulva and vagina: Secondary | ICD-10-CM

## 2016-05-20 DIAGNOSIS — B9689 Other specified bacterial agents as the cause of diseases classified elsewhere: Secondary | ICD-10-CM

## 2016-05-20 DIAGNOSIS — N760 Acute vaginitis: Principal | ICD-10-CM

## 2016-05-20 DIAGNOSIS — B373 Candidiasis of vulva and vagina: Secondary | ICD-10-CM

## 2016-05-20 LAB — NUSWAB VG+, CANDIDA 6SP
ATOPOBIUM VAGINAE: HIGH {score} — AB
BVAB 2: HIGH Score — AB
CANDIDA ALBICANS, NAA: NEGATIVE
CANDIDA GLABRATA, NAA: NEGATIVE
CANDIDA KRUSEI, NAA: NEGATIVE
CANDIDA LUSITANIAE, NAA: NEGATIVE
CANDIDA PARAPSILOSIS, NAA: NEGATIVE
CHLAMYDIA TRACHOMATIS, NAA: NEGATIVE
Candida tropicalis, NAA: NEGATIVE
Megasphaera 1: HIGH Score — AB
NEISSERIA GONORRHOEAE, NAA: NEGATIVE
Trich vag by NAA: NEGATIVE

## 2016-05-20 MED ORDER — METRONIDAZOLE 500 MG PO TABS: 500.0000 mg | ORAL_TABLET | Freq: Two times a day (BID) | ORAL | Status: DC

## 2016-05-20 MED ORDER — FLUCONAZOLE 150 MG PO TABS: 150.0000 mg | ORAL_TABLET | Freq: Once | ORAL | Status: DC

## 2016-05-25 ENCOUNTER — Encounter: Payer: Self-pay | Admitting: *Deleted

## 2016-05-25 LAB — PAP IG W/ RFLX HPV ASCU: PAP Smear Comment: 0

## 2016-05-25 LAB — HPV DNA PROBE HIGH RISK, AMPLIFIED: HPV, HIGH-RISK: NEGATIVE

## 2016-06-13 ENCOUNTER — Emergency Department (HOSPITAL_BASED_OUTPATIENT_CLINIC_OR_DEPARTMENT_OTHER)
Admission: EM | Admit: 2016-06-13 | Discharge: 2016-06-13 | Disposition: A | Discharge status: Home / Self Care | Payer: BLUE CROSS/BLUE SHIELD | Attending: Emergency Medicine | Admitting: Emergency Medicine

## 2016-06-13 ENCOUNTER — Encounter (HOSPITAL_BASED_OUTPATIENT_CLINIC_OR_DEPARTMENT_OTHER): Payer: Self-pay

## 2016-06-13 DIAGNOSIS — K610 Anal abscess: Secondary | ICD-10-CM | POA: Insufficient documentation

## 2016-06-13 DIAGNOSIS — J45909 Unspecified asthma, uncomplicated: Secondary | ICD-10-CM | POA: Insufficient documentation

## 2016-06-13 DIAGNOSIS — N946 Dysmenorrhea, unspecified: Secondary | ICD-10-CM

## 2016-06-13 DIAGNOSIS — Z87891 Personal history of nicotine dependence: Secondary | ICD-10-CM | POA: Insufficient documentation

## 2016-06-13 MED ORDER — SODIUM BICARBONATE 4 % IV SOLN
5.0000 mL | Freq: Once | INTRAVENOUS | Status: DC
  Filled 2016-06-13: qty 5

## 2016-06-13 MED ORDER — TRAMADOL HCL 50 MG PO TABS: 50.0000 mg | ORAL_TABLET | Freq: Four times a day (QID) | ORAL | Status: DC | PRN

## 2016-06-13 MED ORDER — BUPIVACAINE HCL (PF) 0.5 % IJ SOLN
10.0000 mL | Freq: Once | INTRAMUSCULAR | Status: DC
  Filled 2016-06-13: qty 10

## 2016-06-13 MED ORDER — IBUPROFEN 800 MG PO TABS: 800.0000 mg | ORAL_TABLET | Freq: Three times a day (TID) | ORAL | Status: DC | PRN

## 2016-06-13 NOTE — ED Notes (Signed)
Pt d/c'd by other RN, pt not seen by this RN.

## 2016-06-13 NOTE — ED Provider Notes (Signed)
CSN: [id number]     Arrival date & time 06/13/16  1633 History   First MD Initiated Contact with Patient 06/13/16 1649     Chief Complaint  Patient presents with  . Abscess     (Consider location/radiation/quality/duration/timing/severity/associated sxs/prior Treatment) Patient is a 30 y.o. female presenting with abscess. The history is provided by the patient. No language interpreter was used.  Abscess Location:  Ano-genital Ano-genital abscess location:  Gluteal cleft Size:  2 Abscess quality: fluctuance, painful and redness   Red streaking: no   Duration:  1 week Progression:  Worsening Pain details:    Quality:  Pressure   Severity:  Severe   Timing:  Constant   Progression:  Worsening Chronicity:  New Context: not diabetes, not immunosuppression, not injected drug use, not insect bite/sting and not skin injury   Relieved by:  Nothing Worsened by:  Nothing tried Ineffective treatments:  Warm compresses and warm water soaks Associated symptoms: no anorexia, no fatigue, no fever, no headaches, no nausea and no vomiting   Risk factors: no hx of MRSA and no prior abscess     Past Medical History  Diagnosis Date  . Asthma   . Hx of migraines   . Abnormal Pap smear   . UYQIHKVQ(259.5Headache(784.0)    Past Surgical History  Procedure Laterality Date  . Ankle fracture surgery    . Elective abortion    . Cervical cone biopsy     Family History  Problem Relation Age of Onset  . Diabetes Mother    Social History  Substance Use Topics  . Smoking status: Former Smoker -- 0.25 packs/day for 2 years    Types: Cigarettes    Quit date: 06/17/2012  . Smokeless tobacco: Never Used  . Alcohol Use: 0.0 oz/week    0 Standard drinks or equivalent per week     Comment: rare wine drinker   OB History    Gravida Para Term Preterm AB TAB SAB Ectopic Multiple Living   3 2 2  1 1    2      Review of Systems  Constitutional: Negative for fever and fatigue.  Gastrointestinal: Negative for  nausea, vomiting and anorexia.  Skin: Negative for wound.  Neurological: Negative for headaches.  All other systems reviewed and are negative.     Allergies  Review of patient's allergies indicates no known allergies.  Home Medications   Prior to Admission medications   Medication Sig Start Date End Date Taking? Authorizing Provider  HYDROcodone-ibuprofen (VICOPROFEN) 7.5-200 MG tablet Take 1 tablet by mouth every 8 (eight) hours as needed for moderate pain. 05/17/16   Brock Badharles A Harper, MD  ibuprofen (ADVIL,MOTRIN) 800 MG tablet Take 1 tablet (800 mg total) by mouth every 8 (eight) hours as needed. 05/17/16   Brock Badharles A Harper, MD   BP 131/84 mmHg  Pulse 98  Temp(Src) 98.6 F (37 C) (Oral)  Resp 18  Ht 5\' 6"  (1.676 m)  Wt 94.348 kg  BMI 33.59 kg/m2  SpO2 100%  LMP 06/02/2016 Physical Exam  Constitutional: She is oriented to person, place, and time. She appears well-developed and well-nourished. No distress.  HENT:  Head: Normocephalic and atraumatic.  Eyes: Conjunctivae are normal. No scleral icterus.  Neck: Normal range of motion.  Cardiovascular: Normal rate, regular rhythm and normal heart sounds.  Exam reveals no gallop and no friction rub.   No murmur heard. Pulmonary/Chest: Effort normal and breath sounds normal. No respiratory distress.  Abdominal: Soft. Bowel sounds are  normal. She exhibits no distension and no mass. There is no tenderness. There is no guarding.  Genitourinary:  2 cm fluctuant abscess about 4 cm inferior of the anus in the left gluteus  Neurological: She is alert and oriented to person, place, and time.  Skin: Skin is warm and dry. She is not diaphoretic.  Nursing note and vitals reviewed.   ED Course  .Marland Kitchen.Incision and Drainage Date/Time: 06/13/2016 6:09 PM Performed by: Arthor CaptainHARRIS, Ginni Eichler Authorized by: Arthor CaptainHARRIS, Faten Frieson Risks and benefits: risks, benefits and alternatives were discussed Patient identity confirmed: verbally with patient Type:  abscess Body area: anogenital Location details: perianal Anesthesia: local infiltration Local anesthetic: bupivacaine 0.5% without epinephrine and NaHCO3 (sodium bicarbonate) Anesthetic total: 5 ml Scalpel size: 11 Incision type: elliptical Incision depth: dermal Complexity: simple Drainage: purulent Wound treatment: wound left open Patient tolerance: Patient tolerated the procedure well with no immediate complications   (including critical care time) Labs Review Labs Reviewed - No data to display  Imaging Review No results found. I have personally reviewed and evaluated these images and lab results as part of my medical decision-making.   EKG Interpretation None      MDM   Final diagnoses:  Perianal abscess   Patient with perianal abscess.  no pain with defecation. No fevers or chillls no hx of previous abscess, crohns or  Patient with skin abscess amenable to incision and drainage.  Abscess was not large enough to warrant packing or drain,  wound recheck in 2 days. Encouraged home warm soaks and flushing.  Mild signs of cellulitis is surrounding skin.  Will d/c to home.  No antibiotic therapy is indicated.     Arthor CaptainAbigail Sandra Brents, PA-C 06/13/16 1814  Alvira MondayErin Schlossman, MD 06/14/16 1341

## 2016-06-13 NOTE — ED Notes (Signed)
Abscess "in between my anus and my labia" x 1 week-NAD-slow steady gait

## 2016-06-13 NOTE — ED Notes (Signed)
Report received from Jana HakimJulie C., RN.

## 2016-06-13 NOTE — ED Notes (Addendum)
[facility]

## 2016-06-13 NOTE — Discharge Instructions (Signed)
ument Reviewed: 07/17/2013 Elsevier Interactive Patient Education 2016 Elsevier Inc. Incision and Drainage, Care After Refer to this sheet in the next few weeks. These instructions provide you with information on caring for yourself after your procedure. Your caregiver may also give you more specific instructions. Your treatment has been planned according to current medical practices, but problems sometimes occur. Call your caregiver if you have any problems or questions after your procedure. HOME CARE INSTRUCTIONS  If antibiotic medicine is given, take it as directed. Finish it even if you start to feel better. Only take over-the-counter or prescription medicines for pain, discomfort, or fever as directed by your caregiver. Keep all follow-up appointments as directed by your caregiver. Change any bandages (dressings) as directed by your caregiver. Replace old dressings with clean dressings. Wash your hands before and after caring for your wound. You will receive specific instructions for cleansing and caring for your wound.  SEEK MEDICAL CARE IF:  You have increased pain, swelling, or redness around the wound. You have increased drainage, smell, or bleeding from the wound. You have muscle aches, chills, or you feel generally sick. You have a fever. MAKE SURE YOU:  Understand these instructions. Will watch your condition. Will get help right away if you are not doing well or get worse.   This information is not intended to replace advice given to you by your health care provider. Make sure you discuss any questions you have with your health care provider.   Document Released: 02/27/2012 Document Revised: 12/26/2014 Document Reviewed: 02/27/2012 Elsevier Interactive Patient Education 2016 Elsevier Inc[employer]  Incision and Drainage Incision and drainage is a procedure in which a sac-like structure (cystic structure) is opened and drained. The area to be drained usually contains material such as  pus, fluid, or blood.  LET YOUR CAREGIVER KNOW ABOUT:   Allergies to medicine.  Medicines taken, including vitamins, herbs, eyedrops, over-the-counter medicines, and creams.  Use of steroids (by mouth or creams).  Previous problems with anesthetics or numbing medicines.  History of bleeding problems or blood clots.  Previous surgery.  Other health problems, including diabetes and kidney problems.  Possibility of pregnancy, if this applies. RISKS AND COMPLICATIONS  Pain.  Bleeding.  Scarring.  Infection. BEFORE THE PROCEDURE  You may need to have an ultrasound or other imaging tests to see how large or deep your cystic structure is. Blood tests may also be used to determine if you have an infection or how severe the infection is. You may need to have a tetanus shot. PROCEDURE  The affected area is cleaned with a cleaning fluid. The cyst area will then be numbed with a medicine (local anesthetic). A small incision will be made in the cystic structure. A syringe or catheter may be used to drain the contents of the cystic structure, or the contents may be squeezed out. The area will then be flushed with a cleansing solution. After cleansing the area, it is often gently packed with a gauze or another wound dressing. Once it is packed, it will be covered with gauze and tape or some other type of wound dressing. AFTER THE PROCEDURE   Often, you will be allowed to go home right after the procedure.  You may be given antibiotic medicine to prevent or heal an infection.  If the area was packed with gauze or some other wound dressing, you will likely need to come back in 1 to 2 days to get it removed.  The area should heal in  about 14 days.   This information is not intended to replace advice given to you by your health care provider. Make sure you discuss any questions you have with your health care provider.   Document Released: 05/31/2001 Document Revised: 06/05/2012 Document  Reviewed: 01/30/2012 Elsevier Interactive Patient Education 2016 Elsevier Inc.  Perianal Abscess An abscess is an infected area that contains a collection of pus and debris. A perianal abscess is one that occurs in the perineal area, which is the area between the anus and the scrotum in males and between the anus and the vagina in females. Perianal abscesses can vary in size. Without treatment, a perianal abscess can become larger and cause other problems. CAUSES  Glands in the perineal area can become plugged up with debris. When this happens, an abscess may form.  SIGNS AND SYMPTOMS  The most common symptoms of a perianal abscess are:  Swelling and redness in the area of the abscess. The redness may go beyond the abscess and appear as a red streak on the skin.  Pain in the area of the abscess. Other possible symptoms include:   A visible lump or a lump that can be felt when touching the area and is usually painful.  Bleeding or pus-like discharge from the area.  Fever.  General weakness. DIAGNOSIS  Your health care provider will take a medical history and examine the area. This may involve examining the rectal area with a gloved hand (digital rectal exam). For women, it may require a careful vaginal exam. Sometimes, the health care provider needs to look into the rectum using a probe or scope. TREATMENT  Treatment often requires making a cut (incision) in the abscess to drain the pus. This can sometimes be done in your health care provider's office or an emergency department after giving you medicine to numb the area (local anesthetic). For larger or deeper abscesses, surgery may be required to drain the abscess. Antibiotic medicines are sometimes given if there is infection of the surrounding tissue (cellulitis). In some cases, gauze is packed into the abscess to continue draining the area. Frequent sitz baths may be recommended to help the wound heal and to reduce the chance of the  abscess coming back. HOME CARE INSTRUCTIONS   Only take over-the-counter or prescription medicines for pain, fever, or discomfort as directed by your health care provider.  Take antibiotic medicine as directed. Make sure you finish it even if you start to feel better.  If gauze is used in the abscess, follow your health care provider's instructions for removing or changing the gauze. It can usually be removed in 2-3 days.  If one or more drains have been placed in the abscess cavity, be careful not to pull at them. Your health care provider will tell you how long they need to remain in place.  Take warm sitz baths 3-4 times a day and after bowel movements. This will help reduce pain and swelling.  Keep the skin around the abscess clean and dry. Avoid cleaning the area too much.  Avoid scratching the abscess area.  Avoid using colored or perfumed toilet papers. SEEK MEDICAL CARE IF:   You have trouble having a bowel movement or passing urine.  Your pain or swelling in the affected area does not seem to be improving.  The gauze packing or the drains come out before the planned time. SEEK IMMEDIATE MEDICAL CARE IF:   You have problems moving or using your legs.  You have severe  or increasing pain.  Your swelling in the affected area suddenly gets worse.  You have a large increase in bleeding or passing of pus.  You have chills or a fever. MAKE SURE YOU:   Understand these instructions.  Will watch your condition.  Will get help right away if you are not doing well or get worse.   This information is not intended to replace advice given to you by your health care provider. Make sure you discuss any questions you have with your health care provider.

## 2016-06-15 ENCOUNTER — Emergency Department (HOSPITAL_BASED_OUTPATIENT_CLINIC_OR_DEPARTMENT_OTHER)
Admission: EM | Admit: 2016-06-15 | Discharge: 2016-06-15 | Disposition: A | Discharge status: Home / Self Care | Payer: BLUE CROSS/BLUE SHIELD | Attending: Emergency Medicine | Admitting: Emergency Medicine

## 2016-06-15 ENCOUNTER — Encounter (HOSPITAL_BASED_OUTPATIENT_CLINIC_OR_DEPARTMENT_OTHER): Payer: Self-pay | Admitting: Emergency Medicine

## 2016-06-15 DIAGNOSIS — Z5189 Encounter for other specified aftercare: Secondary | ICD-10-CM

## 2016-06-15 DIAGNOSIS — Z87891 Personal history of nicotine dependence: Secondary | ICD-10-CM | POA: Insufficient documentation

## 2016-06-15 DIAGNOSIS — Z4800 Encounter for change or removal of nonsurgical wound dressing: Secondary | ICD-10-CM | POA: Insufficient documentation

## 2016-06-15 DIAGNOSIS — Z791 Long term (current) use of non-steroidal anti-inflammatories (NSAID): Secondary | ICD-10-CM | POA: Insufficient documentation

## 2016-06-15 DIAGNOSIS — J45909 Unspecified asthma, uncomplicated: Secondary | ICD-10-CM | POA: Insufficient documentation

## 2016-06-15 DIAGNOSIS — Z79899 Other long term (current) drug therapy: Secondary | ICD-10-CM | POA: Insufficient documentation

## 2016-06-15 NOTE — ED Notes (Signed)
Pt here to have perineal abscess re-checked.

## 2016-06-15 NOTE — Discharge Instructions (Signed)
You were seen today for wound check. Your abscess site appears to be well healing.  There are no signs of infection at this time. Continue wound care at home. If you develop worsening pain, fevers, warmth to the area should have it reevaluated.  Wound Check If you have a wound, it may take some time to heal. Eventually, a scar will form. The scar will also fade with time. It is important to take care of your wound while it is healing. This helps to protect your wound from infection.  HOW SHOULD I TAKE CARE OF MY WOUND AT HOME?  Some wounds are allowed to close on their own or are repaired at a later date. There are many different ways to close and cover a wound, including stitches (sutures), skin glue, and adhesive strips. Follow your health care provider's instructions about:  Wound care.  Bandage (dressing) changes and removal.  Wound closure removal.  Take medicines only as directed by your health care provider.  Keep all follow-up visits as directed by your health care provider. This is important.  Do not take baths, swim, or use a hot tub until your health care provider approves. You may shower as directed by your health care provider.  Keep your wound clean and dry. WHAT AFFECTS SCAR FORMATION? Scars affect each person differently. How your body scars depends on:  The location and size of your wound.  Traits that you inherited from your parents (genetic predisposition).  How you take care of your wound. Irritation and inflammation increase the amount of scar formation.  Sun exposure. This can darken a scar. WHEN SHOULD I CALL OR SEE MY HEALTH CARE PROVIDER? Call or see your health care provider if:  You have redness, swelling, or pain at your wound site.  You have fluid, blood, or pus coming from your wound.  You have muscle aches, chills, or a general ill feeling.  You notice a bad smell coming from the wound.  Your wound separates after the sutures, staples, or skin  adhesive strips have been removed.  You have persistent nausea or vomiting.  You have a fever.  You are dizzy. WHEN SHOULD I CALL 911 OR GO TO THE EMERGENCY ROOM? Call 911 or go to the emergency room if:  You faint.  You have difficulty breathing.   This information is not intended to replace advice given to you by your health care provider. Make sure you discuss any questions you have with your health care provider.   Document Released: 09/10/2004 Document Revised: 12/26/2014 Document Reviewed: 09/16/2014 Elsevier Interactive Patient Education [employer] Elsevier Inc.

## 2016-06-15 NOTE — ED Provider Notes (Signed)
CSN: [id number]     Arrival date & time 06/15/16  [id number] History   First MD Initiated Contact with Patient 06/15/16 [phone]     Chief Complaint  Patient presents with  . Wound Check     (Consider location/radiation/quality/duration/timing/severity/associated sxs/prior Treatment) HPI  This is a 30 year old female who presents for wound check. She was seen and evaluated 2 days ago. At that time she had a abscessed drained just right of the gluteal cleft. It was not packed. Patient reports for routine wound check. Reports improved pain. She states that she had some drainage yesterday but none today. No fevers.  Past Medical History  Diagnosis Date  . Asthma   . Hx of migraines   . Abnormal Pap smear   . MVHQIONG(295.2Headache(784.0)    Past Surgical History  Procedure Laterality Date  . Ankle fracture surgery    . Elective abortion    . Cervical cone biopsy     Family History  Problem Relation Age of Onset  . Diabetes Mother    Social History  Substance Use Topics  . Smoking status: Former Smoker -- 0.25 packs/day for 2 years    Types: Cigarettes    Quit date: 06/17/2012  . Smokeless tobacco: Never Used  . Alcohol Use: 0.0 oz/week    0 Standard drinks or equivalent per week     Comment: rare wine drinker   OB History    Gravida Para Term Preterm AB TAB SAB Ectopic Multiple Living   3 2 2  1 1    2      Review of Systems  Constitutional: Negative for fever.  Skin: Positive for wound. Negative for color change.  All other systems reviewed and are negative.     Allergies  Review of patient's allergies indicates no known allergies.  Home Medications   Prior to Admission medications   Medication Sig Start Date End Date Taking? Authorizing Provider  ibuprofen (ADVIL,MOTRIN) 800 MG tablet Take 1 tablet (800 mg total) by mouth every 8 (eight) hours as needed. 06/13/16   Arthor CaptainAbigail Harris, PA-C  traMADol (ULTRAM) 50 MG tablet Take 1 tablet (50 mg total) by mouth every 6 (six) hours as  needed. 06/13/16   Arthor CaptainAbigail Harris, PA-C   BP 111/73 mmHg  Pulse 94  Temp(Src) 98 F (36.7 C) (Oral)  Resp 18  Ht 5\' 6"  (1.676 m)  Wt 208 lb (94.348 kg)  BMI 33.59 kg/m2  SpO2 99%  LMP 06/02/2016 Physical Exam  Constitutional: She is oriented to person, place, and time. She appears well-developed and well-nourished. No distress.  Overweight  HENT:  Head: Normocephalic and atraumatic.  Cardiovascular: Normal rate and regular rhythm.   Pulmonary/Chest: Effort normal. No respiratory distress.  Neurological: She is alert and oriented to person, place, and time.  Skin: Skin is warm and dry.  Wound site appears clean dry and intact, small less than 1 cm opening without any drainage noted, no fluctuance noted but induration still present, no overlying skin changes suggestive of cellulitis  Psychiatric: She has a normal mood and affect.  Nursing note and vitals reviewed.   ED Course  Procedures (including critical care time) Labs Review Labs Reviewed - No data to display  Imaging Review No results found. I have personally reviewed and evaluated these images and lab results as part of my medical decision-making.   EKG Interpretation None      MDM   Final diagnoses:  Visit for wound check    Patient presents for  wound check. It is clean dry and intact. No signs of cellulitis. Minimal residual induration with no spontaneous drainage and no fluctuance. Patient can return to work. Continue wound care.  After history, exam, and medical workup I feel the patient has been appropriately medically screened and is safe for discharge home. Pertinent diagnoses were discussed with the patient. Patient was given return precautions.     Shon Batonourtney F Horton, MD 06/15/16 [phone]

## 2016-07-14 ENCOUNTER — Emergency Department (HOSPITAL_BASED_OUTPATIENT_CLINIC_OR_DEPARTMENT_OTHER)
Admission: EM | Admit: 2016-07-14 | Discharge: 2016-07-14 | Disposition: A | Discharge status: Home / Self Care | Payer: BLUE CROSS/BLUE SHIELD | Attending: Emergency Medicine | Admitting: Emergency Medicine

## 2016-07-14 ENCOUNTER — Encounter (HOSPITAL_BASED_OUTPATIENT_CLINIC_OR_DEPARTMENT_OTHER): Payer: Self-pay | Admitting: *Deleted

## 2016-07-14 DIAGNOSIS — B3731 Acute candidiasis of vulva and vagina: Secondary | ICD-10-CM

## 2016-07-14 DIAGNOSIS — B373 Candidiasis of vulva and vagina: Secondary | ICD-10-CM | POA: Insufficient documentation

## 2016-07-14 DIAGNOSIS — Z87891 Personal history of nicotine dependence: Secondary | ICD-10-CM | POA: Diagnosis not present

## 2016-07-14 DIAGNOSIS — J45909 Unspecified asthma, uncomplicated: Secondary | ICD-10-CM | POA: Insufficient documentation

## 2016-07-14 DIAGNOSIS — N898 Other specified noninflammatory disorders of vagina: Secondary | ICD-10-CM | POA: Diagnosis present

## 2016-07-14 LAB — URINALYSIS, ROUTINE W REFLEX MICROSCOPIC
BILIRUBIN URINE: NEGATIVE
GLUCOSE, UA: NEGATIVE mg/dL
HGB URINE DIPSTICK: NEGATIVE
Ketones, ur: NEGATIVE mg/dL
Nitrite: NEGATIVE
PH: 6 (ref 5.0–8.0)
Protein, ur: NEGATIVE mg/dL
SPECIFIC GRAVITY, URINE: 1.006 (ref 1.005–1.030)

## 2016-07-14 LAB — WET PREP, GENITAL
Clue Cells Wet Prep HPF POC: NONE SEEN
SPERM: NONE SEEN
Trich, Wet Prep: NONE SEEN

## 2016-07-14 LAB — URINE MICROSCOPIC-ADD ON

## 2016-07-14 LAB — PREGNANCY, URINE: Preg Test, Ur: NEGATIVE

## 2016-07-14 MED ORDER — FLUCONAZOLE 100 MG PO TABS
200.0000 mg | ORAL_TABLET | Freq: Once | ORAL | Status: AC
  Administered 2016-07-14: 200 mg via ORAL
  Filled 2016-07-14: qty 2

## 2016-07-14 NOTE — ED Triage Notes (Signed)
Pt c/o vaginal discharge and itching x 3 days  °

## 2016-07-14 NOTE — ED Notes (Signed)
MD at bedside. 

## 2016-07-14 NOTE — ED Provider Notes (Signed)
[facility]   CSN: [id number] Arrival date & time: 07/14/16  [id number]  First Provider Contact:  First MD Initiated Contact with Patient 07/14/16 [phone]        History   Chief Complaint Chief Complaint  Patient presents with  . Vaginal Discharge    HPI Joanna Mcpherson is a 30 y.o. female.  The history is provided by the patient.  Vaginal Discharge   This is a new problem. Episode onset: 3 days ago. The problem occurs constantly. The problem has been rapidly worsening. The discharge occurs spontaneously. The discharge was white. She is not pregnant. She has not missed her period. Associated symptoms include genital burning and genital itching. Pertinent negatives include no fever, no abdominal pain, no nausea, no vomiting, no dysuria, no genital lesions and no perineal odor. Treatments tried: Patient thought she may have a yeast infection and used a vaginal insert yesterday. The treatment provided no relief. Her past medical history is significant for STD. Past medical history comments: Last diagnosed with chlamydia approximately 6 years ago.  She is currently sexually active with one partner and does not use protection.    Past Medical History:  Diagnosis Date  . Abnormal Pap smear   . Asthma   . Headache(784.0)   . Hx of migraines     Patient Active Problem List   Diagnosis Date Noted  . Active labor 04/21/2013  . Normal delivery 04/21/2013  . GERD without esophagitis 04/09/2013  . Irregular contractions 03/14/2013    Past Surgical History:  Procedure Laterality Date  . ANKLE FRACTURE SURGERY    . CERVICAL CONE BIOPSY    . elective abortion      OB History    Gravida Para Term Preterm AB Living   SAB TAB Ectopic Multiple Live Births     1             Home Medications    Prior to Admission medications   Medication Sig Start Date End Date Taking? Authorizing Provider  ibuprofen (ADVIL,MOTRIN) 800 MG tablet Take 1 tablet  (800 mg total) by mouth every 8 (eight) hours as needed. 06/13/16   Arthor Captain, PA-C  traMADol (ULTRAM) 50 MG tablet Take 1 tablet (50 mg total) by mouth every 6 (six) hours as needed. 06/13/16   Arthor Captain, PA-C    Family History Family History  Problem Relation Age of Onset  . Diabetes Mother     Social History Social History  Substance Use Topics  . Smoking status: Former Smoker    Packs/day: 0.25    Years: 2.00    Types: Cigarettes    Quit date: 06/17/2012  . Smokeless tobacco: Never Used  . Alcohol use 0.0 oz/week     Comment: rare wine drinker     Allergies   Review of patient's allergies indicates no known allergies.   Review of Systems Review of Systems  Constitutional: Negative for fever.  Gastrointestinal: Negative for abdominal pain, nausea and vomiting.  Genitourinary: Positive for vaginal discharge. Negative for dysuria.  All other systems reviewed and are negative.    Physical Exam Updated Vital Signs LMP 06/28/2016   Physical Exam  Constitutional: She is oriented to person, place, and time. She appears well-developed and well-nourished. No distress.  HENT:  Head: Normocephalic and atraumatic.  Mouth/Throat: Oropharynx is clear and moist.  Eyes: Conjunctivae and EOM are normal. Pupils are equal, round, and reactive to  light.  Neck: Normal range of motion. Neck supple.  Cardiovascular: Normal rate, regular rhythm and intact distal pulses.   No murmur heard. Pulmonary/Chest: Effort normal and breath sounds normal. No respiratory distress. She has no wheezes. She has no rales.  Abdominal: Soft. She exhibits no distension. There is no tenderness. There is no rebound and no guarding.  Genitourinary: Uterus normal. Cervix exhibits discharge. Cervix exhibits no motion tenderness and no friability. Right adnexum displays no tenderness. Left adnexum displays no tenderness. No tenderness in the vagina. Vaginal discharge found.  Genitourinary Comments:  Copious thick curd-like discharge  Musculoskeletal: Normal range of motion. She exhibits no edema or tenderness.  Neurological: She is alert and oriented to person, place, and time.  Skin: Skin is warm and dry. No rash noted. No erythema.  Psychiatric: She has a normal mood and affect. Her behavior is normal.  Nursing note and vitals reviewed.    ED Treatments / Results  Labs (all labs ordered are listed, but only abnormal results are displayed) Labs Reviewed  WET PREP, GENITAL - Abnormal; Notable for the following:       Result Value   Yeast Wet Prep HPF POC PRESENT (*)    WBC, Wet Prep HPF POC MANY (*)    All other components within normal limits  PREGNANCY, URINE  URINALYSIS, ROUTINE W REFLEX MICROSCOPIC (NOT AT [facility])  HIV ANTIBODY (ROUTINE TESTING)  RPR  GC/CHLAMYDIA PROBE AMP ([place]) NOT AT [facility]    EKG  EKG Interpretation None       Radiology No results found.  Procedures Procedures (including critical care time)  Medications Ordered in ED Medications - No data to display   Initial Impression / Assessment and Plan / ED Course  I have reviewed the triage vital signs and the nursing notes.  Pertinent labs & imaging results that were available during my care of the patient were reviewed by me and considered in my medical decision making (see chart for details).  Clinical Course    Patient presenting with 3 days of vaginal itching and discharge. She recently changed her laundry detergent and also used condom prior to symptoms starting. She denies any infectious symptoms and has no pelvic tenderness, flank tenderness or urinary symptoms. She is currently sexually active with only 1 partner who is asymptomatic. On exam patient appears to have increased infection with significant amounts of curd-like discharge. Encouraged her to continue using the vaginal inserts and she was also given a dose of Diflucan here. Wet prep shows yeast and white blood cells. Low  suspicion for STI at this time. Cultures were sent. Patient states she was recently checked and everything was negative.  Final Clinical Impressions(s) / ED Diagnoses   Final diagnoses:  Vaginal yeast infection    New Prescriptions Current Discharge Medication List       Gwyneth Sprout, MD 07/14/16 [phone]

## 2016-07-15 LAB — HIV ANTIBODY (ROUTINE TESTING W REFLEX): HIV Screen 4th Generation wRfx: NONREACTIVE

## 2016-07-15 LAB — GC/CHLAMYDIA PROBE AMP (~~LOC~~) NOT AT ARMC
CHLAMYDIA, DNA PROBE: NEGATIVE
NEISSERIA GONORRHEA: NEGATIVE

## 2016-07-15 LAB — RPR: RPR Ser Ql: NONREACTIVE

## 2016-08-15 ENCOUNTER — Ambulatory Visit: Payer: BLUE CROSS/BLUE SHIELD | Admitting: Obstetrics

## 2016-11-13 IMAGING — CR DG CHEST 2V
2 series · 2 of 2 positions shown · non-contrast
Comparison: None.

CLINICAL DATA: One episode of cough with blood tinged sputum. Sore
throat and headache for 1 day.

EXAM:
CHEST  2 VIEW

[w chest pa]
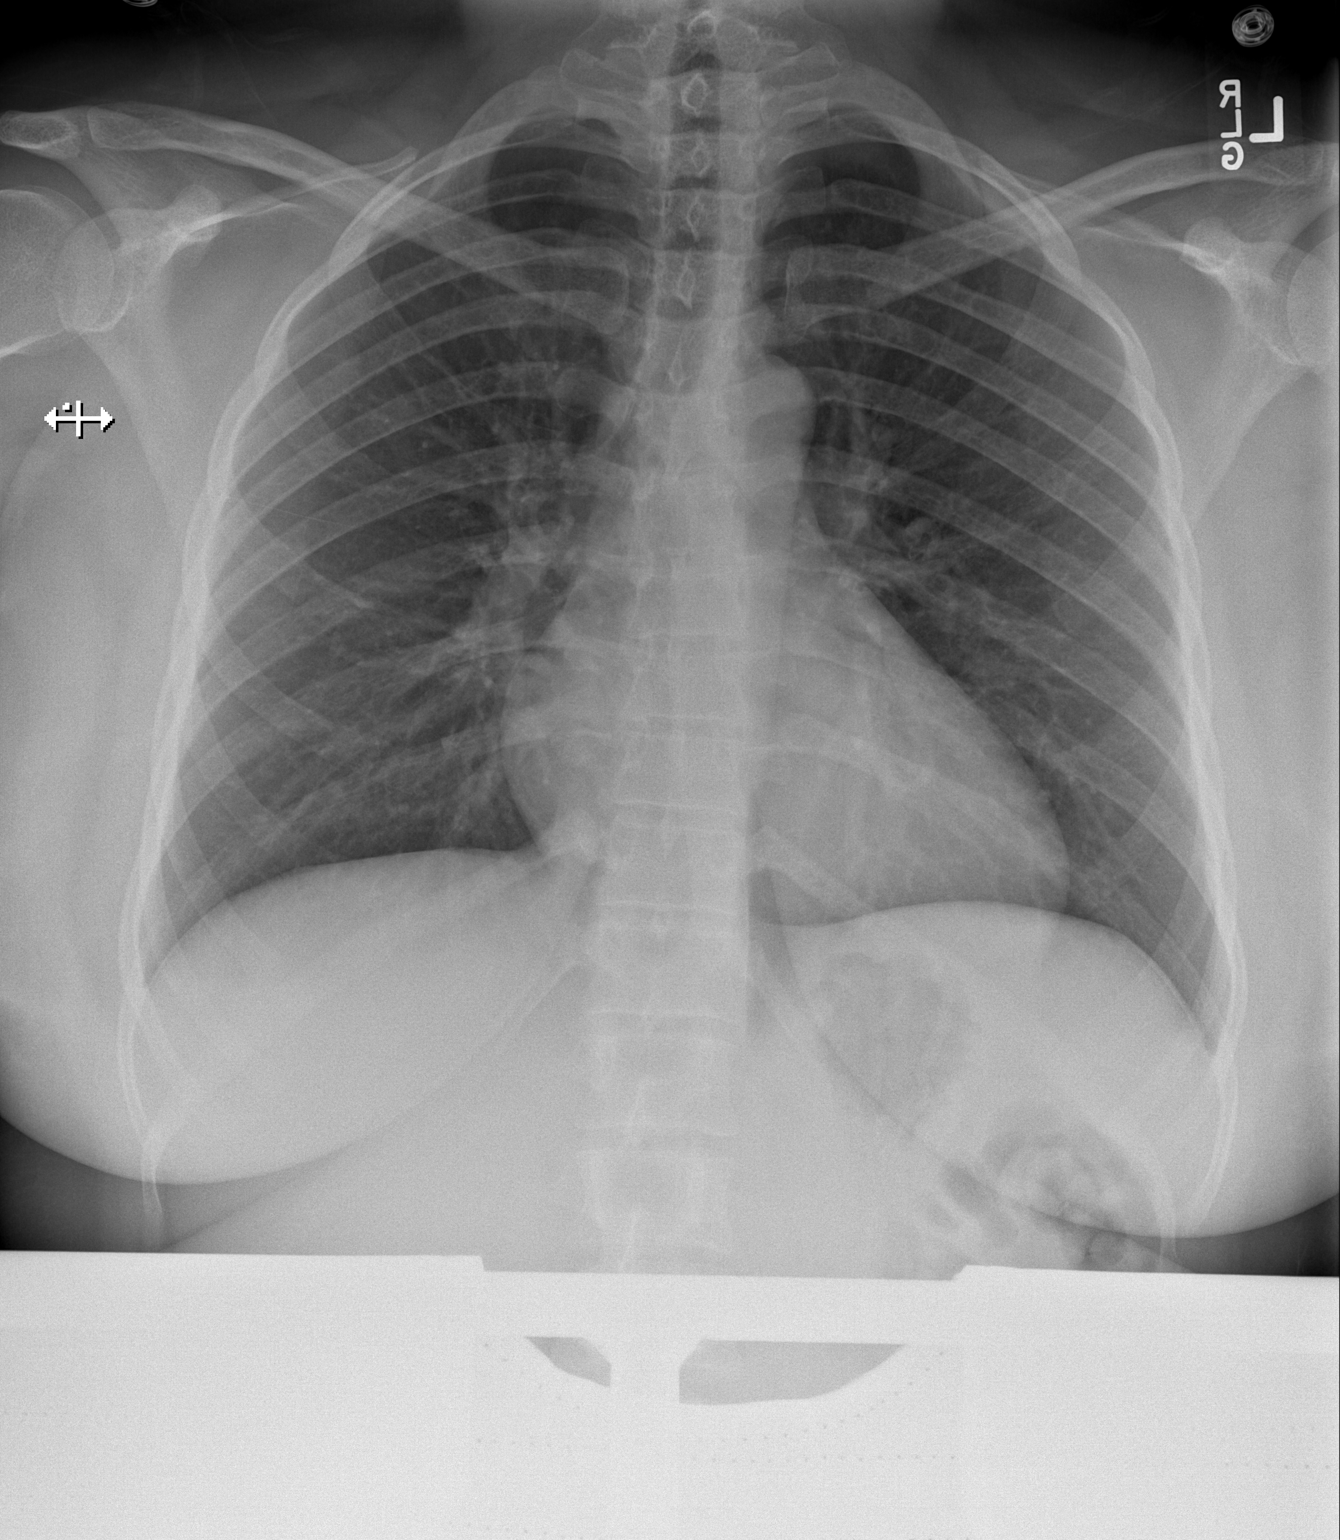

[w chest lat]
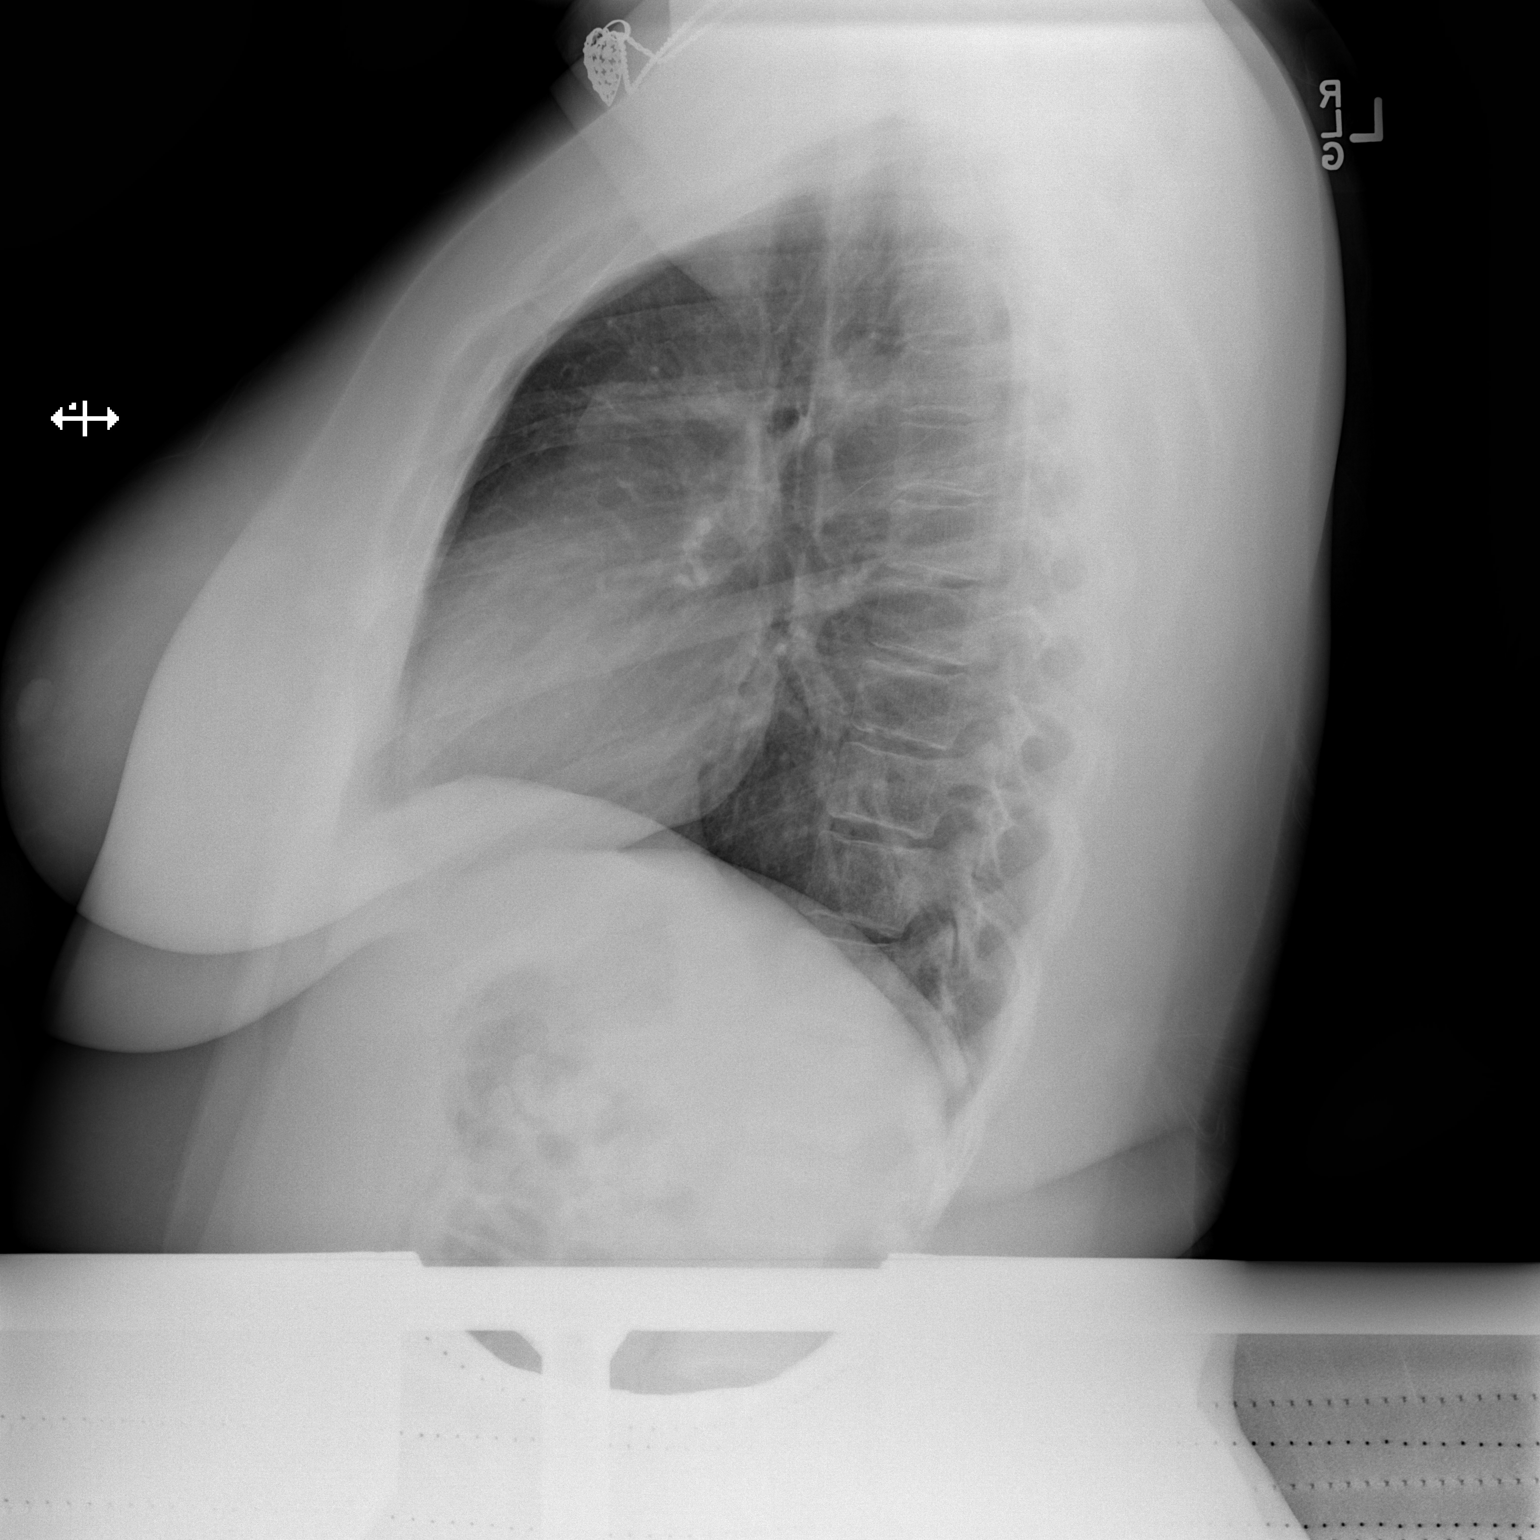

[2 of 2 positions shown; findings below may reference images not displayed]

FINDINGS: Lungs are clear. Heart size and pulmonary vascularity are normal. No
adenopathy. No bone lesions.
IMPRESSION: No edema or consolidation.

## 2017-04-28 ENCOUNTER — Telehealth: Payer: Self-pay | Admitting: *Deleted

## 2017-04-28 NOTE — Telephone Encounter (Signed)
Patient is calling to request a refill of her Ibuprofen 800 mg.

## 2017-05-01 ENCOUNTER — Other Ambulatory Visit: Payer: Self-pay | Admitting: Obstetrics

## 2017-05-01 DIAGNOSIS — N946 Dysmenorrhea, unspecified: Secondary | ICD-10-CM

## 2017-05-01 MED ORDER — IBUPROFEN 800 MG PO TABS: 800.0000 mg | ORAL_TABLET | Freq: Three times a day (TID) | ORAL | 5 refills | Status: DC | PRN

## 2017-05-01 NOTE — Telephone Encounter (Signed)
Ibuprofen Rx. 

## 2017-08-30 ENCOUNTER — Emergency Department (HOSPITAL_BASED_OUTPATIENT_CLINIC_OR_DEPARTMENT_OTHER)
Admission: EM | Admit: 2017-08-30 | Discharge: 2017-08-30 | Disposition: A | Discharge status: Home / Self Care | Payer: BLUE CROSS/BLUE SHIELD | Attending: Emergency Medicine | Admitting: Emergency Medicine

## 2017-08-30 ENCOUNTER — Encounter (HOSPITAL_BASED_OUTPATIENT_CLINIC_OR_DEPARTMENT_OTHER): Payer: Self-pay

## 2017-08-30 DIAGNOSIS — N760 Acute vaginitis: Secondary | ICD-10-CM | POA: Insufficient documentation

## 2017-08-30 DIAGNOSIS — A599 Trichomoniasis, unspecified: Secondary | ICD-10-CM | POA: Insufficient documentation

## 2017-08-30 DIAGNOSIS — F17210 Nicotine dependence, cigarettes, uncomplicated: Secondary | ICD-10-CM | POA: Insufficient documentation

## 2017-08-30 DIAGNOSIS — B9689 Other specified bacterial agents as the cause of diseases classified elsewhere: Secondary | ICD-10-CM

## 2017-08-30 DIAGNOSIS — J45909 Unspecified asthma, uncomplicated: Secondary | ICD-10-CM | POA: Insufficient documentation

## 2017-08-30 LAB — URINALYSIS, ROUTINE W REFLEX MICROSCOPIC
BILIRUBIN URINE: NEGATIVE
GLUCOSE, UA: NEGATIVE mg/dL
KETONES UR: NEGATIVE mg/dL
NITRITE: NEGATIVE
PH: 6 (ref 5.0–8.0)
Protein, ur: NEGATIVE mg/dL
Specific Gravity, Urine: 1.025 (ref 1.005–1.030)

## 2017-08-30 LAB — URINALYSIS, MICROSCOPIC (REFLEX)

## 2017-08-30 LAB — WET PREP, GENITAL
SPERM: NONE SEEN
Yeast Wet Prep HPF POC: NONE SEEN

## 2017-08-30 LAB — PREGNANCY, URINE: Preg Test, Ur: NEGATIVE

## 2017-08-30 MED ORDER — METRONIDAZOLE 500 MG PO TABS: 2000.0000 mg | ORAL_TABLET | Freq: Once | ORAL | 0 refills | Status: AC

## 2017-08-30 MED FILL — metroNIDAZOLE 500 MG TABS: 500 | 1 days supply | Qty: 4 | Fill #0

## 2017-08-30 NOTE — Discharge Instructions (Signed)
Take Flagyl as prescribed. It is a one-time dose, take all 4 pills at once. Do not drink in the next 24-48 hours, as this can cause liver damage. Stay well-hydrated. Follow-up with your primary care doctor if symptoms are not improving. Return to the emergency room if you develop fever, chills, nausea, vomiting, worsening pain, or any new or worsening symptoms.

## 2017-08-30 NOTE — ED Triage Notes (Signed)
C/o pelvic pain-denies d/c but states she has a foul odor-NAD-steady gait

## 2017-08-30 NOTE — ED Provider Notes (Signed)
[facility] Provider Note   CSN: [id number] Arrival date & time: 08/30/17  1251     History   Chief Complaint Chief Complaint  Patient presents with  . Pelvic Pain    HPI Joanna Mcpherson is a 31 y.o. female with vaginal discomfort and vaginal odor.  Patient states that for the past 4 days, she's had some vaginal irritation/discomfort. 2 days ago, she started to notice a fishy smell. She denies any discharge. She denies fever, chills, nausea, vomiting, abdominal pain, urinary symptoms, or abnormal bowel movements. She is sexually active with one partner, they use condoms every time. Last menstrual cycle was August 20. She does not use birth control. She has been tested for STDs in the past, but has not been positive recently.  HPI  Past Medical History:  Diagnosis Date  . Abnormal Pap smear   . Asthma   . Headache(784.0)   . Hx of migraines     Patient Active Problem List   Diagnosis Date Noted  . Active labor 04/21/2013  . Normal delivery 04/21/2013  . GERD without esophagitis 04/09/2013  . Irregular contractions 03/14/2013    Past Surgical History:  Procedure Laterality Date  . ANKLE FRACTURE SURGERY    . CERVICAL CONE BIOPSY    . elective abortion      OB History    Gravida Para Term Preterm AB Living   SAB TAB Ectopic Multiple Live Births     1     2       Home Medications    Prior to Admission medications   Medication Sig Start Date End Date Taking? Authorizing Provider  ibuprofen (ADVIL,MOTRIN) 800 MG tablet Take 1 tablet (800 mg total) by mouth every 8 (eight) hours as needed. 05/01/17   Brock Bad, MD  metroNIDAZOLE (FLAGYL) 500 MG tablet Take 4 tablets (2,000 mg total) by mouth once. 08/30/17 08/30/17  Lilybelle Mayeda, PA-C    Family History Family History  Problem Relation Age of Onset  . Diabetes Mother     Social History Social History  Substance Use Topics  . Smoking status: Current Every Day Smoker    Packs/day: 0.25    Years: 2.00    Types: Cigarettes  . Smokeless tobacco: Never Used  . Alcohol use 0.0 oz/week     Comment: occ     Allergies   Patient has no known allergies.   Review of Systems Review of Systems  Constitutional: Negative for chills and fever.  Gastrointestinal: Negative for abdominal pain, nausea and vomiting.  Genitourinary: Positive for vaginal pain. Negative for dysuria, flank pain, frequency, hematuria and menstrual problem.       Fishy smell from vagina     Physical Exam Updated Vital Signs BP 107/73 (BP Location: Left Arm)   Pulse 97   Temp 98.9 F (37.2 C) (Oral)   Resp 18   Ht  (1.651 m)   Wt 89.4 kg (197 lb)   LMP 08/06/2017   SpO2 99%   BMI 32.78 kg/m   Physical Exam  Constitutional: She is oriented to person, place, and time. She appears well-developed and well-nourished. No distress.  HENT:  Head: Normocephalic and atraumatic.  Eyes: EOM are normal.  Neck: Normal range of motion.  Cardiovascular: Normal rate, regular rhythm and intact distal pulses.   Pulmonary/Chest: Effort normal and breath sounds normal. No respiratory distress. She has no wheezes.  Abdominal: Soft. She  exhibits no distension and no mass. There is no tenderness. There is no rebound and no guarding.  Genitourinary: Rectum normal, vagina normal and uterus normal. Pelvic exam was performed with patient supine. There is no rash, tenderness or lesion on the right labia. There is no rash, tenderness or lesion on the left labia. Cervix exhibits discharge. Cervix exhibits no motion tenderness and no friability. Right adnexum displays no mass, no tenderness and no fullness. Left adnexum displays no mass, no tenderness and no fullness.  Genitourinary Comments: Chaperone present. Thick white discharge from cervix. No cervical motion tenderness.  Musculoskeletal: Normal range of motion.  Neurological: She is alert and oriented to person, place, and time.  Skin: Skin is  warm. No rash noted.  Psychiatric: She has a normal mood and affect.  Nursing note and vitals reviewed.    ED Treatments / Results  Labs (all labs ordered are listed, but only abnormal results are displayed) Labs Reviewed  WET PREP, GENITAL - Abnormal; Notable for the following:       Result Value   Trich, Wet Prep PRESENT (*)    Clue Cells Wet Prep HPF POC PRESENT (*)    WBC, Wet Prep HPF POC MANY (*)    All other components within normal limits  URINALYSIS, ROUTINE W REFLEX MICROSCOPIC - Abnormal; Notable for the following:    Hgb urine dipstick TRACE (*)    Leukocytes, UA SMALL (*)    All other components within normal limits  URINALYSIS, MICROSCOPIC (REFLEX) - Abnormal; Notable for the following:    Bacteria, UA FEW (*)    Squamous Epithelial / LPF 0-5 (*)    All other components within normal limits  PREGNANCY, URINE  RPR  HIV ANTIBODY (ROUTINE TESTING)  GC/CHLAMYDIA PROBE AMP ([place]    EKG  EKG Interpretation None       Radiology No results found.  Procedures Procedures (including critical care time)  Medications Ordered in ED Medications - No data to display   Initial Impression / Assessment and Plan / ED Course  I have reviewed the triage vital signs and the nursing notes.  Pertinent labs & imaging results that were available during my care of the patient were reviewed by me and considered in my medical decision making (see chart for details).     Agent presenting with several days of vaginal complaints including discomfort and smell. Physical exam shows patient is afebrile, not tachycardic, without abdominal pain. Pelvic shows thick white discharge without cervical motion tenderness. Doubt PID at this time. Will order wet prep, UA, urine pregnancy, STD testing.  Wet prep positive for trichomonas and clue cells. UA negative for infection, pregnancy negative. Discussed findings with patient. Discussed pending cultures and STD testing.  Patient states she does not want to be treated for gonorrhea and chlamydia today, and will follow-up for treatment if results are positive. Will treat for treatment of this and BV with one time dose of oral Flagyl. At this time, patient appears safe for discharge. Return precautions given. Patient states she understands and agrees to plan.  Final Clinical Impressions(s) / ED Diagnoses   Final diagnoses:  BV (bacterial vaginosis)  Trichomonas infection    New Prescriptions Discharge Medication List as of 08/30/2017  2:30 PM    START taking these medications   Details  metroNIDAZOLE (FLAGYL) 500 MG tablet Take 4 tablets (2,000 mg total) by mouth once., Starting Wed 08/30/2017, Print  Alveria Apley, PA-C 08/30/17 1722    Melene Plan, DO 08/31/17 1607

## 2017-08-30 NOTE — ED Notes (Signed)
Pt verbalized understanding of discharge instructions and denies any further questions at this time.   

## 2017-08-31 LAB — GC/CHLAMYDIA PROBE AMP (~~LOC~~) NOT AT ARMC
Chlamydia: NEGATIVE
Neisseria Gonorrhea: NEGATIVE

## 2017-08-31 LAB — RPR: RPR: NONREACTIVE

## 2017-08-31 LAB — HIV ANTIBODY (ROUTINE TESTING W REFLEX): HIV SCREEN 4TH GENERATION: NONREACTIVE

## 2018-02-12 ENCOUNTER — Encounter (HOSPITAL_BASED_OUTPATIENT_CLINIC_OR_DEPARTMENT_OTHER): Payer: Self-pay | Admitting: Emergency Medicine

## 2018-02-12 ENCOUNTER — Emergency Department (HOSPITAL_BASED_OUTPATIENT_CLINIC_OR_DEPARTMENT_OTHER)
Admission: EM | Admit: 2018-02-12 | Discharge: 2018-02-12 | Disposition: A | Discharge status: Home / Self Care | Payer: Self-pay | Attending: Physician Assistant | Admitting: Physician Assistant

## 2018-02-12 ENCOUNTER — Other Ambulatory Visit: Payer: Self-pay

## 2018-02-12 DIAGNOSIS — F17210 Nicotine dependence, cigarettes, uncomplicated: Secondary | ICD-10-CM | POA: Insufficient documentation

## 2018-02-12 DIAGNOSIS — B9689 Other specified bacterial agents as the cause of diseases classified elsewhere: Secondary | ICD-10-CM | POA: Insufficient documentation

## 2018-02-12 DIAGNOSIS — N9089 Other specified noninflammatory disorders of vulva and perineum: Secondary | ICD-10-CM

## 2018-02-12 DIAGNOSIS — J45909 Unspecified asthma, uncomplicated: Secondary | ICD-10-CM | POA: Insufficient documentation

## 2018-02-12 DIAGNOSIS — N760 Acute vaginitis: Secondary | ICD-10-CM | POA: Insufficient documentation

## 2018-02-12 LAB — URINALYSIS, ROUTINE W REFLEX MICROSCOPIC
BILIRUBIN URINE: NEGATIVE
Glucose, UA: NEGATIVE mg/dL
KETONES UR: NEGATIVE mg/dL
Nitrite: NEGATIVE
PROTEIN: NEGATIVE mg/dL
Specific Gravity, Urine: <1.005 — ABNORMAL LOW (ref 1.005–1.030)
pH: 5.5 (ref 5.0–8.0)

## 2018-02-12 LAB — WET PREP, GENITAL
Sperm: NONE SEEN
Trich, Wet Prep: NONE SEEN
Yeast Wet Prep HPF POC: NONE SEEN

## 2018-02-12 LAB — URINALYSIS, MICROSCOPIC (REFLEX)

## 2018-02-12 LAB — PREGNANCY, URINE: PREG TEST UR: NEGATIVE

## 2018-02-12 MED ORDER — METRONIDAZOLE 500 MG PO TABS: 500.0000 mg | ORAL_TABLET | Freq: Two times a day (BID) | ORAL | 0 refills | Status: DC

## 2018-02-12 MED FILL — metroNIDAZOLE 500 MG TABS: 500 | 7 days supply | Qty: 14 | Fill #0

## 2018-02-12 NOTE — ED Provider Notes (Incomplete)
[facility] EMERGENCY DEPARTMENT Provider Note   CSN: [id number] Arrival date & time: 02/12/18  [id number]     History   Chief Complaint Chief Complaint  Patient presents with  . Perineal irritation    HPI Joanna Mcpherson is a 32 y.o. female with a string of abnormal Pap smear who presents today for evaluation of vulvar irritation after showering burning with urination.  She reports that approximately 4 days ago she switched her laundry detergent and fabric softener and after that the symptoms started.  She denies any other possible new exposures.  She reports suprapubic pain and cramping but adds that she just started her period about one hour ago and these symptoms are consistent with her normal menstrual symptoms.  She reports burning with urination.    HPI  Past Medical History:  Diagnosis Date  . Abnormal Pap smear   . Asthma   . Headache(784.0)   . Hx of migraines     Patient Active Problem List   Diagnosis Date Noted  . Active labor 04/21/2013  . Normal delivery 04/21/2013  . GERD without esophagitis 04/09/2013  . Irregular contractions 03/14/2013    Past Surgical History:  Procedure Laterality Date  . ANKLE FRACTURE SURGERY    . CERVICAL CONE BIOPSY    . elective abortion      OB History    Gravida Para Term Preterm AB Living   3 2 2   1 2    SAB TAB Ectopic Multiple Live Births     1     2       Home Medications    Prior to Admission medications   Medication Sig Start Date End Date Taking? Authorizing Provider  HYDROcodone-acetaminophen (NORCO/VICODIN) 5-325 MG tablet Take 1 tablet by mouth every 6 (six) hours as needed for moderate pain.   Yes [provider]  ibuprofen (ADVIL,MOTRIN) 800 MG tablet Take 1 tablet (800 mg total) by mouth every 8 (eight) hours as needed. 05/01/17   Brock BadHarper, Charles A, MD    Family History Family History  Problem Relation Age of Onset  . Diabetes Mother     Social History Social History   Tobacco  Use  . Smoking status: Current Some Day Smoker    Packs/day: 0.25    Years: 2.00    Pack years: 0.50    Types: Cigarettes  . Smokeless tobacco: Never Used  Substance Use Topics  . Alcohol use: Yes    Alcohol/week: 0.0 oz    Comment: occ  . Drug use: No     Allergies   Patient has no known allergies.   Review of Systems Review of Systems  Constitutional: Negative for chills and fever.  Gastrointestinal: Negative for abdominal pain, diarrhea, nausea and vomiting.  Genitourinary: Positive for dysuria, frequency, pelvic pain, vaginal bleeding (Normal, menstruation) and vaginal pain. Negative for difficulty urinating, flank pain, hematuria, menstrual problem and vaginal discharge.  All other systems reviewed and are negative.    Physical Exam Updated Vital Signs BP 124/89 (BP Location: Right Arm)   Pulse 91   Temp 99 F (37.2 C) (Oral)   Resp 16   Ht 5\' 6"  (1.676 m)   Wt 86.2 kg (190 lb)   LMP 01/19/2018   SpO2 100%   BMI 30.67 kg/m   Physical Exam  Constitutional: She is oriented to person, place, and time. She appears well-developed and well-nourished. No distress.  HENT:  Head: Normocephalic and atraumatic.  Eyes: Conjunctivae are normal.  Right eye exhibits no discharge. Left eye exhibits no discharge. No scleral icterus.  Neck: Normal range of motion.  Cardiovascular: Normal rate and regular rhythm.  Pulmonary/Chest: Effort normal. No stridor. No respiratory distress.  Abdominal: Soft. Bowel sounds are normal. She exhibits no distension. There is no tenderness.  Genitourinary:  Genitourinary Comments: ***  Musculoskeletal: She exhibits no edema or deformity.  Neurological: She is alert and oriented to person, place, and time. She exhibits normal muscle tone.  Skin: Skin is warm and dry. She is not diaphoretic.  Psychiatric: She has a normal mood and affect. Her behavior is normal.  Nursing note and vitals reviewed.    ED Treatments / Results  Labs (all  labs ordered are listed, but only abnormal results are displayed) Labs Reviewed  WET PREP, GENITAL  PREGNANCY, URINE  URINALYSIS, ROUTINE W REFLEX MICROSCOPIC  GC/CHLAMYDIA PROBE AMP ([place]) NOT AT [facility]    EKG  EKG Interpretation None       Radiology No results found.  Procedures Procedures (including critical care time)  Medications Ordered in ED Medications - No data to display   Initial Impression / Assessment and Plan / ED Course  I have reviewed the triage vital signs and the nursing notes.  Pertinent labs & imaging results that were available during my care of the patient were reviewed by me and considered in my medical decision making (see chart for details).     ***  Final Clinical Impressions(s) / ED Diagnoses   Final diagnoses:  None    ED Discharge Orders    None

## 2018-02-12 NOTE — ED Notes (Signed)
ED Provider at bedside discussing test results and dispo plan of care. 

## 2018-02-12 NOTE — ED Triage Notes (Addendum)
Perineal irritation after shower and burning with urination.  Also some suprapubic pain. Pt sts her period just started when she went to collect her urine specimen.

## 2018-02-12 NOTE — ED Provider Notes (Signed)
[facility] EMERGENCY DEPARTMENT Provider Note   CSN: [id number] Arrival date & time: 02/12/18  [id number]     History   Chief Complaint Chief Complaint  Patient presents with  . Perineal irritation    HPI Joanna Mcpherson is a 32 y.o. female with a history of abnormal Pap smear who presents today for evaluation of vulvar irritation after showering burning with urination.  She reports that approximately 4 days ago she switched her laundry detergent and fabric softener and after that the symptoms started.  She denies any other possible new exposures.  She reports suprapubic pain and cramping but adds that she just started her period about one hour ago and these symptoms are consistent with her normal menstrual symptoms.  She reports burning with urination.  She has not tried anything for relief of her symptoms.     HPI  Past Medical History:  Diagnosis Date  . Abnormal Pap smear   . Asthma   . Headache(784.0)   . Hx of migraines     Patient Active Problem List   Diagnosis Date Noted  . Active labor 04/21/2013  . Normal delivery 04/21/2013  . GERD without esophagitis 04/09/2013  . Irregular contractions 03/14/2013    Past Surgical History:  Procedure Laterality Date  . ANKLE FRACTURE SURGERY    . CERVICAL CONE BIOPSY    . elective abortion      OB History    Gravida Para Term Preterm AB Living   3 2 2   1 2    SAB TAB Ectopic Multiple Live Births     1     2       Home Medications    Prior to Admission medications   Medication Sig Start Date End Date Taking? Authorizing Provider  HYDROcodone-acetaminophen (NORCO/VICODIN) 5-325 MG tablet Take 1 tablet by mouth every 6 (six) hours as needed for moderate pain.   Yes [provider]  ibuprofen (ADVIL,MOTRIN) 800 MG tablet Take 1 tablet (800 mg total) by mouth every 8 (eight) hours as needed. 05/01/17   Brock BadHarper, Charles A, MD  metroNIDAZOLE (FLAGYL) 500 MG tablet Take 1 tablet (500 mg total) by mouth 2  (two) times daily. 02/12/18   Cristina GongHammond, Joanna Mcpherson    Family History Family History  Problem Relation Age of Onset  . Diabetes Mother     Social History Social History   Tobacco Use  . Smoking status: Current Some Day Smoker    Packs/day: 0.25    Years: 2.00    Pack years: 0.50    Types: Cigarettes  . Smokeless tobacco: Never Used  Substance Use Topics  . Alcohol use: Yes    Alcohol/week: 0.0 oz    Comment: occ  . Drug use: No     Allergies   Patient has no known allergies.   Review of Systems Review of Systems Review of Systems  Constitutional: Negative for chills and fever.  Gastrointestinal: Negative for abdominal pain, diarrhea, nausea and vomiting.  Genitourinary: Positive for dysuria, frequency, pelvic pain, vaginal bleeding (Normal, menstruation) and vaginal pain. Negative for difficulty urinating, flank pain, hematuria, menstrual problem and vaginal discharge.  All other systems reviewed and are negative.  Physical Exam Updated Vital Signs BP 108/88 (BP Location: Right Arm)   Pulse 77   Temp 99 F (37.2 C) (Oral)   Resp 16   Ht 5\' 6"  (1.676 m)   Wt 86.2 kg (190 lb)   LMP 01/19/2018   SpO2 100%  BMI 30.67 kg/m   Physical Exam Constitutional: She is oriented to person, place, and time. She appears well-developed and well-nourished. No distress.  HENT:  Head: Normocephalic and atraumatic.  Eyes: Conjunctivae are normal. Right eye exhibits no discharge. Left eye exhibits no discharge. No scleral icterus.  Neck: Normal range of motion.  Cardiovascular: Normal rate and regular rhythm.  Pulmonary/Chest: Effort normal. No stridor. No respiratory distress.  Abdominal: Soft. Bowel sounds are normal. She exhibits no distension. There is no tenderness.  Genitourinary:  Genitourinary Comments:  exam performed with chaperone in room.  There is diffuse irritation and redness of the external genitalia with out localized lesion.  There no adnexal tenderness  or fullness.  No CMT.  Scant discharge. Musculoskeletal: She exhibits no edema or deformity.  Neurological: She is alert and oriented to person, place, and time. She exhibits normal muscle tone.  Skin: Skin is warm and dry. She is not diaphoretic.  Psychiatric: She has a normal mood and affect. Her behavior is normal.  Nursing note and vitals reviewed.   ED Treatments / Results  Labs (all labs ordered are listed, but only abnormal results are displayed) Labs Reviewed  WET PREP, GENITAL - Abnormal; Notable for the following components:      Result Value   Clue Cells Wet Prep HPF POC PRESENT (*)    WBC, Wet Prep HPF POC MODERATE (*)    All other components within normal limits  URINALYSIS, ROUTINE W REFLEX MICROSCOPIC - Abnormal; Notable for the following components:   Specific Gravity, Urine <1.005 (*)    Hgb urine dipstick SMALL (*)    Leukocytes, UA TRACE (*)    All other components within normal limits  URINALYSIS, MICROSCOPIC (REFLEX) - Abnormal; Notable for the following components:   Bacteria, UA FEW (*)    Squamous Epithelial / LPF 0-5 (*)    All other components within normal limits  PREGNANCY, URINE  GC/CHLAMYDIA PROBE AMP ([place]) NOT AT [facility]    EKG  EKG Interpretation None       Radiology No results found.  Procedures Procedures (including critical care time)  Medications Ordered in ED Medications - No data to display   Initial Impression / Assessment and Plan / ED Course  I have reviewed the triage vital signs and the nursing notes.  Pertinent labs & imaging results that were available during my care of the patient were reviewed by me and considered in my medical decision making (see chart for details).    Joanna Mcpherson presents today for evaluation of vulvar itching and irritation.  She had exposure to a new detergent prior to the onset.  She had diffuse irritation on external genitalia during exam.  No cervical motion tenderness or adnexal  fullness or tenderness.  Wet prep positive for BV.  She was given a prescription for Flagyl and instructed not to drink or consume alcohol while taking this medication.  She was informed that she has test for gonorrhea and chlamydia pending and if positive she will be called and will need treatment.  She was given return precautions, and states her understanding.  Urine with small blood, trace leukocytes and few bacteria along with 0-5 squamous epithelial cells which I suspect is all contamination from her being on her menstrual cycle and having BV.  Her pelvic pain is were reportedly consistent with her normal menstrual cramps and is not concerning to her.  She was given return precautions and states her understanding, instructed to follow-up  with her PCP.   Final Clinical Impressions(s) / ED Diagnoses   Final diagnoses:  Vulvar irritation  BV (bacterial vaginosis)    ED Discharge Orders        Ordered    metroNIDAZOLE (FLAGYL) 500 MG tablet  2 times daily     02/12/18 1139       Norman Clay 02/12/18 1915    Abelino Derrick, MD 02/16/18 [phone]

## 2018-02-12 NOTE — Discharge Instructions (Addendum)
Please switch back to a detergent you know you will not react to.  Please limit new exposures to lotions, products, detergent, and soaps.    Today your diagnosed with bacterial vaginosis and received a prescription for metronidazole also known as Flagyl. It is very important that you do not consume any alcohol while taking this medication as it will cause you to become violently ill.  You have cultures pending for gonorrhea and chlamydia.  If these are positive you will be called and you will need treatment.

## 2018-02-13 LAB — GC/CHLAMYDIA PROBE AMP (~~LOC~~) NOT AT ARMC
CHLAMYDIA, DNA PROBE: NEGATIVE
Neisseria Gonorrhea: NEGATIVE

## 2018-05-15 ENCOUNTER — Other Ambulatory Visit: Payer: Self-pay | Admitting: Obstetrics

## 2018-05-15 DIAGNOSIS — N946 Dysmenorrhea, unspecified: Secondary | ICD-10-CM

## 2018-08-11 ENCOUNTER — Encounter (HOSPITAL_BASED_OUTPATIENT_CLINIC_OR_DEPARTMENT_OTHER): Payer: Self-pay

## 2018-08-11 ENCOUNTER — Other Ambulatory Visit: Payer: Self-pay

## 2018-08-11 ENCOUNTER — Emergency Department (HOSPITAL_BASED_OUTPATIENT_CLINIC_OR_DEPARTMENT_OTHER)
Admission: EM | Admit: 2018-08-11 | Discharge: 2018-08-12 | Disposition: A | Discharge status: Home / Self Care | Payer: Self-pay | Attending: Emergency Medicine | Admitting: Emergency Medicine

## 2018-08-11 DIAGNOSIS — B3731 Acute candidiasis of vulva and vagina: Secondary | ICD-10-CM

## 2018-08-11 DIAGNOSIS — J45909 Unspecified asthma, uncomplicated: Secondary | ICD-10-CM | POA: Insufficient documentation

## 2018-08-11 DIAGNOSIS — B9689 Other specified bacterial agents as the cause of diseases classified elsewhere: Secondary | ICD-10-CM

## 2018-08-11 DIAGNOSIS — B373 Candidiasis of vulva and vagina: Secondary | ICD-10-CM | POA: Insufficient documentation

## 2018-08-11 DIAGNOSIS — F17210 Nicotine dependence, cigarettes, uncomplicated: Secondary | ICD-10-CM | POA: Insufficient documentation

## 2018-08-11 DIAGNOSIS — N760 Acute vaginitis: Secondary | ICD-10-CM

## 2018-08-11 LAB — URINALYSIS, ROUTINE W REFLEX MICROSCOPIC
Bilirubin Urine: NEGATIVE
Glucose, UA: NEGATIVE mg/dL
HGB URINE DIPSTICK: NEGATIVE
Ketones, ur: NEGATIVE mg/dL
LEUKOCYTES UA: NEGATIVE
NITRITE: NEGATIVE
PROTEIN: NEGATIVE mg/dL
SPECIFIC GRAVITY, URINE: 1.015 (ref 1.005–1.030)
pH: 6 (ref 5.0–8.0)

## 2018-08-11 LAB — PREGNANCY, URINE: PREG TEST UR: NEGATIVE

## 2018-08-11 LAB — WET PREP, GENITAL
CLUE CELLS WET PREP: NONE SEEN
Sperm: NONE SEEN
Trich, Wet Prep: NONE SEEN
Yeast Wet Prep HPF POC: NONE SEEN

## 2018-08-11 MED ORDER — FLUCONAZOLE 50 MG PO TABS
150.0000 mg | ORAL_TABLET | Freq: Once | ORAL | Status: AC
  Administered 2018-08-12: 150 mg via ORAL
  Filled 2018-08-11: qty 1

## 2018-08-11 MED ORDER — METRONIDAZOLE 500 MG PO TABS: 500.0000 mg | ORAL_TABLET | Freq: Two times a day (BID) | ORAL | 0 refills | Status: DC

## 2018-08-11 MED ORDER — FLUCONAZOLE 150 MG PO TABS: 150.0000 mg | ORAL_TABLET | Freq: Once | ORAL | 0 refills | Status: AC

## 2018-08-11 NOTE — ED Triage Notes (Signed)
Pt reports vaginal discharge/ discomfort x several days and frequent urination. Pt tried monistat with no relief.

## 2018-08-11 NOTE — ED Provider Notes (Signed)
[facility] EMERGENCY DEPARTMENT Provider Note   CSN: [id number] Arrival date & time: 08/11/18  2158     History   Chief Complaint Chief Complaint  Patient presents with  . Vaginal Discharge    HPI Joanna Mcpherson is a 32 y.o. female.  HPI [age] year old female with past medical history as below here with vaginal discharge.  Patient has a history of recurrent bacterial vaginosis and yeast vaginitis.  She states that she had intercourse with her significant other multiple times over the weekend.  She states that they had anal to vaginal oral sex and she is concerned this caused an infection.  She denies concern for sexually transmitted disease.  Over the last several days, she is had significant vaginal itching.  Denies any discharge.  She tried over-the-counter yeast medicine over the last day without significant relief.  No fevers or chills.  No lateral or abdominal pain.  No dyspareunia.  No fevers or chills.  No right upper quadrant pain.  Itching is constant, they are worse with walking.  No alleviating factors.  Symptoms feel similar to her previous episodes of BV.   Past Medical History:  Diagnosis Date  . Abnormal Pap smear   . Asthma   . Headache(784.0)   . Hx of migraines     Patient Active Problem List   Diagnosis Date Noted  . Active labor 04/21/2013  . Normal delivery 04/21/2013  . GERD without esophagitis 04/09/2013  . Irregular contractions 03/14/2013    Past Surgical History:  Procedure Laterality Date  . ANKLE FRACTURE SURGERY    . CERVICAL CONE BIOPSY    . elective abortion       OB History    Gravida  3   Para  2   Term  2   Preterm      AB  1   Living  2     SAB      TAB  1   Ectopic      Multiple      Live Births  2            Home Medications    Prior to Admission medications   Medication Sig Start Date End Date Taking? Authorizing Provider  HYDROcodone-acetaminophen (NORCO/VICODIN) 5-325 MG tablet Take 1  tablet by mouth every 6 (six) hours as needed for moderate pain.   Yes [provider]  ibuprofen (ADVIL,MOTRIN) 800 MG tablet Take 1 tablet (800 mg total) by mouth every 8 (eight) hours as needed. 05/01/17  Yes Brock BadHarper, Charles A, MD  fluconazole (DIFLUCAN) 150 MG tablet Take 1 tablet (150 mg total) by mouth once for 1 dose. 08/12/18 08/12/18  Shaune PollackIsaacs, Salimah Martinovich, MD  metroNIDAZOLE (FLAGYL) 500 MG tablet Take 1 tablet (500 mg total) by mouth 2 (two) times daily. 08/11/18   Shaune PollackIsaacs, Lavaya Defreitas, MD    Family History Family History  Problem Relation Age of Onset  . Diabetes Mother     Social History Social History   Tobacco Use  . Smoking status: Current Some Day Smoker    Packs/day: 0.25    Years: 2.00    Pack years: 0.50    Types: Cigarettes  . Smokeless tobacco: Never Used  Substance Use Topics  . Alcohol use: Yes    Alcohol/week: 0.0 standard drinks    Comment: occ  . Drug use: No     Allergies   Patient has no known allergies.   Review of Systems Review of Systems  Constitutional:  Negative for chills, fatigue and fever.  HENT: Negative for congestion and rhinorrhea.   Eyes: Negative for visual disturbance.  Respiratory: Negative for cough, shortness of breath and wheezing.   Cardiovascular: Negative for chest pain and leg swelling.  Gastrointestinal: Negative for abdominal pain, diarrhea, nausea and vomiting.  Genitourinary: Positive for vaginal discharge. Negative for dysuria, flank pain, vaginal bleeding and vaginal pain.  Musculoskeletal: Negative for neck pain and neck stiffness.  Skin: Negative for rash and wound.  Allergic/Immunologic: Negative for immunocompromised state.  Neurological: Negative for syncope, weakness and headaches.  All other systems reviewed and are negative.    Physical Exam Updated Vital Signs BP 130/80 (BP Location: Left Arm)   Pulse 88   Temp 98.1 F (36.7 C) (Oral)   Resp 18   Ht 5\' 6"  (1.676 m)   Wt 87.1 kg   LMP 07/24/2018    SpO2 100%   BMI 30.99 kg/m   Physical Exam  Constitutional: She is oriented to person, place, and time. She appears well-developed and well-nourished. No distress.  HENT:  Head: Normocephalic and atraumatic.  Eyes: Conjunctivae are normal.  Neck: Neck supple.  Cardiovascular: Normal rate, regular rhythm and normal heart sounds. Exam reveals no friction rub.  No murmur heard. Pulmonary/Chest: Effort normal and breath sounds normal. No respiratory distress. She has no wheezes. She has no rales.  Abdominal: She exhibits no distension.  Genitourinary:  Genitourinary Comments: Copious thick, white vaginal discharge with islands of white plaques on the vaginal walls.  Cervix mildly erythematous, though nontender.  There is no cervical motion tenderness.  No adnexal pain or tenderness.  Musculoskeletal: She exhibits no edema.  Neurological: She is alert and oriented to person, place, and time. She exhibits normal muscle tone.  Skin: Skin is warm. Capillary refill takes less than 2 seconds.  Psychiatric: She has a normal mood and affect.  Nursing note and vitals reviewed.    ED Treatments / Results  Labs (all labs ordered are listed, but only abnormal results are displayed) Labs Reviewed  WET PREP, GENITAL - Abnormal; Notable for the following components:      Result Value   WBC, Wet Prep HPF POC MANY (*)    All other components within normal limits  URINALYSIS, ROUTINE W REFLEX MICROSCOPIC  PREGNANCY, URINE  GC/CHLAMYDIA PROBE AMP ([place]) NOT AT [facility]    EKG None  Radiology No results found.  Procedures Procedures (including critical care time)  Medications Ordered in ED Medications  fluconazole (DIFLUCAN) tablet 150 mg (150 mg Oral Given 08/12/18 0000)     Initial Impression / Assessment and Plan / ED Course  I have reviewed the triage vital signs and the nursing notes.  Pertinent labs & imaging results that were available during my care of the patient were  reviewed by me and considered in my medical decision making (see chart for details).    32 year old female here with mild vaginal irritation in the setting of recent oral sex.  Patient has a long history of bacterial vaginosis and yeast vaginitis with similar symptoms.  Her wet prep today shows many white blood cells, though could be partially erroneous due to recent use of over-the-counter and all medications.  She has no fever, lower abdominal pain, dyspareunia, cervical motion tenderness, or evidence of PID or TOA or torsion.  Will treat symptomatically for recurrent vaginitis, and discharge home.  UA without UTI.  UPT negative.  Final Clinical Impressions(s) / ED Diagnoses   Final diagnoses:  Yeast vaginitis  BV (bacterial vaginosis)    ED Discharge Orders         Ordered    fluconazole (DIFLUCAN) 150 MG tablet   Once     08/11/18 2357    metroNIDAZOLE (FLAGYL) 500 MG tablet  2 times daily     08/11/18 2357           Shaune Pollack, MD 08/12/18 [id number]

## 2018-08-13 LAB — GC/CHLAMYDIA PROBE AMP (~~LOC~~) NOT AT ARMC
CHLAMYDIA, DNA PROBE: NEGATIVE
NEISSERIA GONORRHEA: NEGATIVE

## 2018-09-27 ENCOUNTER — Other Ambulatory Visit: Payer: Self-pay

## 2018-09-27 ENCOUNTER — Encounter (HOSPITAL_BASED_OUTPATIENT_CLINIC_OR_DEPARTMENT_OTHER): Payer: Self-pay | Admitting: *Deleted

## 2018-09-27 ENCOUNTER — Emergency Department (HOSPITAL_BASED_OUTPATIENT_CLINIC_OR_DEPARTMENT_OTHER)
Admission: EM | Admit: 2018-09-27 | Discharge: 2018-09-27 | Disposition: A | Discharge status: Home / Self Care | Payer: Self-pay | Attending: Emergency Medicine | Admitting: Emergency Medicine

## 2018-09-27 DIAGNOSIS — F17210 Nicotine dependence, cigarettes, uncomplicated: Secondary | ICD-10-CM | POA: Insufficient documentation

## 2018-09-27 DIAGNOSIS — N760 Acute vaginitis: Secondary | ICD-10-CM | POA: Insufficient documentation

## 2018-09-27 DIAGNOSIS — B373 Candidiasis of vulva and vagina: Secondary | ICD-10-CM | POA: Insufficient documentation

## 2018-09-27 DIAGNOSIS — Z79899 Other long term (current) drug therapy: Secondary | ICD-10-CM | POA: Insufficient documentation

## 2018-09-27 DIAGNOSIS — B3731 Acute candidiasis of vulva and vagina: Secondary | ICD-10-CM

## 2018-09-27 DIAGNOSIS — J45909 Unspecified asthma, uncomplicated: Secondary | ICD-10-CM | POA: Insufficient documentation

## 2018-09-27 DIAGNOSIS — B9689 Other specified bacterial agents as the cause of diseases classified elsewhere: Secondary | ICD-10-CM | POA: Insufficient documentation

## 2018-09-27 LAB — URINALYSIS, MICROSCOPIC (REFLEX)

## 2018-09-27 LAB — PREGNANCY, URINE: Preg Test, Ur: NEGATIVE

## 2018-09-27 LAB — WET PREP, GENITAL
Sperm: NONE SEEN
TRICH WET PREP: NONE SEEN

## 2018-09-27 LAB — URINALYSIS, ROUTINE W REFLEX MICROSCOPIC
Bilirubin Urine: NEGATIVE
GLUCOSE, UA: NEGATIVE mg/dL
KETONES UR: NEGATIVE mg/dL
LEUKOCYTES UA: NEGATIVE
NITRITE: NEGATIVE
PH: 6.5 (ref 5.0–8.0)
PROTEIN: NEGATIVE mg/dL
Specific Gravity, Urine: 1.025 (ref 1.005–1.030)

## 2018-09-27 MED ORDER — METRONIDAZOLE 500 MG PO TABS: 500.0000 mg | ORAL_TABLET | Freq: Two times a day (BID) | ORAL | 0 refills | Status: DC

## 2018-09-27 MED ORDER — FLUCONAZOLE 50 MG PO TABS
150.0000 mg | ORAL_TABLET | Freq: Once | ORAL | Status: AC
  Administered 2018-09-27: 150 mg via ORAL
  Filled 2018-09-27: qty 1

## 2018-09-27 NOTE — ED Provider Notes (Signed)
[facility] EMERGENCY DEPARTMENT Provider Note   CSN: [id number] Arrival date & time: 09/27/18  1334     History   Chief Complaint Chief Complaint  Patient presents with  . Vaginal Itching    HPI Joanna Mcpherson is a 32 y.o. female.  Patient presents with complaint of vaginal itching and a slight burning with urination over the past 2 days.  Patient reports having a history of yeast infections and bacterial vaginosis.  She denies increased frequency or urinary urgency.  She does not have a significant discharge at this time.  No fevers, nausea, vomiting, or diarrhea.  Patient is sexually active with one partner and is not overly concerned about sexually transmitted infections.  No treatments prior to arrival.  Onset of symptoms acute.  Course is constant.     Past Medical History:  Diagnosis Date  . Abnormal Pap smear   . Asthma   . Headache(784.0)   . Hx of migraines     Patient Active Problem List   Diagnosis Date Noted  . Active labor 04/21/2013  . Normal delivery 04/21/2013  . GERD without esophagitis 04/09/2013  . Irregular contractions 03/14/2013    Past Surgical History:  Procedure Laterality Date  . ANKLE FRACTURE SURGERY    . CERVICAL CONE BIOPSY    . elective abortion       OB History    Gravida  3   Para  2   Term  2   Preterm      AB  1   Living  2     SAB      TAB  1   Ectopic      Multiple      Live Births  2            Home Medications    Prior to Admission medications   Medication Sig Start Date End Date Taking? Authorizing Provider  ibuprofen (ADVIL,MOTRIN) 800 MG tablet Take 1 tablet (800 mg total) by mouth every 8 (eight) hours as needed. 05/01/17  Yes Brock Bad, MD  HYDROcodone-acetaminophen (NORCO/VICODIN) 5-325 MG tablet Take 1 tablet by mouth every 6 (six) hours as needed for moderate pain.    [provider]  metroNIDAZOLE (FLAGYL) 500 MG tablet Take 1 tablet (500 mg total) by mouth 2  (two) times daily. 08/11/18   Shaune Pollack, MD    Family History Family History  Problem Relation Age of Onset  . Diabetes Mother     Social History Social History   Tobacco Use  . Smoking status: Current Some Day Smoker    Packs/day: 0.25    Years: 2.00    Pack years: 0.50    Types: Cigarettes  . Smokeless tobacco: Never Used  Substance Use Topics  . Alcohol use: Yes    Alcohol/week: 0.0 standard drinks    Comment: occ  . Drug use: No     Allergies   Patient has no known allergies.   Review of Systems Review of Systems  Constitutional: Negative for fever.  HENT: Negative for rhinorrhea and sore throat.   Eyes: Negative for redness.  Respiratory: Negative for cough.   Cardiovascular: Negative for chest pain.  Gastrointestinal: Negative for abdominal pain, diarrhea, nausea and vomiting.  Genitourinary: Positive for dysuria and vaginal discharge. Negative for frequency, pelvic pain, urgency, vaginal bleeding and vaginal pain.  Musculoskeletal: Negative for myalgias.  Skin: Negative for rash.  Neurological: Negative for headaches.     Physical Exam  Updated Vital Signs BP 122/81   Pulse 86   Temp 98.2 F (36.8 C) (Oral)   Resp 18   Ht 5\' 6"  (1.676 m)   Wt 86.6 kg   LMP 09/15/2018 (Approximate)   SpO2 98%   BMI 30.83 kg/m   Physical Exam  Constitutional: She appears well-developed and well-nourished.  HENT:  Head: Normocephalic and atraumatic.  Eyes: Conjunctivae are normal. Right eye exhibits no discharge. Left eye exhibits no discharge.  Neck: Normal range of motion. Neck supple.  Cardiovascular: Normal rate, regular rhythm and normal heart sounds.  Pulmonary/Chest: Effort normal and breath sounds normal.  Abdominal: Soft. There is no tenderness.  Genitourinary: Uterus normal. Pelvic exam was performed with patient supine. There is no rash, tenderness or lesion on the right labia. There is no rash, tenderness or lesion on the left labia. Cervix  exhibits no motion tenderness and no discharge. Right adnexum displays no mass and no tenderness. Left adnexum displays no mass and no tenderness. No erythema, tenderness or bleeding in the vagina. Vaginal discharge (Thick, white, homogenous) found.  Neurological: She is alert.  Skin: Skin is warm and dry.  Psychiatric: She has a normal mood and affect.  Nursing note and vitals reviewed.    ED Treatments / Results  Labs (all labs ordered are listed, but only abnormal results are displayed) Labs Reviewed  URINALYSIS, ROUTINE W REFLEX MICROSCOPIC - Abnormal; Notable for the following components:      Result Value   APPearance HAZY (*)    Hgb urine dipstick TRACE (*)    All other components within normal limits  URINALYSIS, MICROSCOPIC (REFLEX) - Abnormal; Notable for the following components:   Bacteria, UA FEW (*)    All other components within normal limits  WET PREP, GENITAL  PREGNANCY, URINE  GC/CHLAMYDIA PROBE AMP ([place]) NOT AT [facility]    EKG None  Radiology No results found.  Procedures Procedures (including critical care time)  Medications Ordered in ED Medications - No data to display   Initial Impression / Assessment and Plan / ED Course  I have reviewed the triage vital signs and the nursing notes.  Pertinent labs & imaging results that were available during my care of the patient were reviewed by me and considered in my medical decision making (see chart for details).     Patient seen and examined. Pelvic performed with NT Marylu Lund chaperone.   Vital signs reviewed and are as follows: BP 122/81   Pulse 86   Temp 98.2 F (36.8 C) (Oral)   Resp 18   Ht 5\' 6"  (1.676 m)   Wt 86.6 kg   LMP 09/15/2018 (Approximate)   SpO2 98%   BMI 30.83 kg/m   3:40 PM patient updated on results.  We will give dose of Diflucan here.  Home with metronidazole.  Encourage PCP follow-up as needed.   Final Clinical Impressions(s) / ED Diagnoses   Final diagnoses:    Vaginal candidiasis  Bacterial vaginosis   Patient with vaginal yeast infection, BV as noted above.  Exam is consistent with these.  STI testing pending.  ED Discharge Orders    None       Renne Crigler, Cordelia Poche 09/27/18 1540    Alvira Monday, MD 09/28/18 1133

## 2018-09-27 NOTE — ED Notes (Signed)
Pelvic cart at bedside. 

## 2018-09-27 NOTE — Discharge Instructions (Signed)
Please read and follow all provided instructions.  Your diagnoses today include:  1. Vaginal candidiasis   2. Bacterial vaginosis     Tests performed today include:  Urine test - no UTI seen  Wet prep - positive for yeast, possible BV  Vital signs. See below for your results today.   Medications prescribed:   Metronidazole - antibiotic  You have been prescribed an antibiotic medicine: take the entire course of medicine even if you are feeling better. Stopping early can cause the antibiotic not to work. Do not drink alcohol when taking this medication.   Take any prescribed medications only as directed.  Home care instructions:  Follow any educational materials contained in this packet.  BE VERY CAREFUL not to take multiple medicines containing Tylenol (also called acetaminophen). Doing so can lead to an overdose which can damage your liver and cause liver failure and possibly death.   Follow-up instructions: Please follow-up with your primary care provider in the next 3 days for further evaluation of your symptoms.   Return instructions:   Please return to the Emergency Department if you experience worsening symptoms.   Please return if you have any other emergent concerns.  Additional Information:  Your vital signs today were: BP 122/81    Pulse 86    Temp 98.2 F (36.8 C) (Oral)    Resp 18    Ht 5\' 6"  (1.676 m)    Wt 86.6 kg    LMP 09/15/2018 (Approximate)    SpO2 98%    BMI 30.83 kg/m  If your blood pressure (BP) was elevated above 135/85 this visit, please have this repeated by your doctor within one month. --------------

## 2018-09-27 NOTE — ED Triage Notes (Signed)
Pt has had vaginal itching and burning X 2 days

## 2018-09-28 LAB — GC/CHLAMYDIA PROBE AMP (~~LOC~~) NOT AT ARMC
Chlamydia: NEGATIVE
Neisseria Gonorrhea: NEGATIVE

## 2018-12-17 ENCOUNTER — Inpatient Hospital Stay (HOSPITAL_COMMUNITY)
Admission: AD | Admit: 2018-12-17 | Discharge: 2018-12-17 | Disposition: A | Discharge status: Home / Self Care | Payer: Self-pay | Source: Ambulatory Visit | Attending: Obstetrics and Gynecology | Admitting: Obstetrics and Gynecology

## 2018-12-18 ENCOUNTER — Encounter (HOSPITAL_BASED_OUTPATIENT_CLINIC_OR_DEPARTMENT_OTHER): Payer: Self-pay | Admitting: Emergency Medicine

## 2018-12-18 ENCOUNTER — Other Ambulatory Visit: Payer: Self-pay

## 2018-12-18 ENCOUNTER — Emergency Department (HOSPITAL_BASED_OUTPATIENT_CLINIC_OR_DEPARTMENT_OTHER)
Admission: EM | Admit: 2018-12-18 | Discharge: 2018-12-18 | Disposition: A | Discharge status: Home / Self Care | Payer: Medicaid Other | Attending: Emergency Medicine | Admitting: Emergency Medicine

## 2018-12-18 DIAGNOSIS — Z79899 Other long term (current) drug therapy: Secondary | ICD-10-CM | POA: Diagnosis not present

## 2018-12-18 DIAGNOSIS — Z3491 Encounter for supervision of normal pregnancy, unspecified, first trimester: Secondary | ICD-10-CM | POA: Diagnosis present

## 2018-12-18 DIAGNOSIS — Z3A00 Weeks of gestation of pregnancy not specified: Secondary | ICD-10-CM | POA: Diagnosis not present

## 2018-12-18 DIAGNOSIS — O99331 Smoking (tobacco) complicating pregnancy, first trimester: Secondary | ICD-10-CM | POA: Diagnosis not present

## 2018-12-18 DIAGNOSIS — F17210 Nicotine dependence, cigarettes, uncomplicated: Secondary | ICD-10-CM | POA: Diagnosis not present

## 2018-12-18 DIAGNOSIS — J45909 Unspecified asthma, uncomplicated: Secondary | ICD-10-CM | POA: Diagnosis not present

## 2018-12-18 LAB — PREGNANCY, URINE: Preg Test, Ur: POSITIVE — AB

## 2018-12-18 NOTE — ED Provider Notes (Signed)
[facility] EMERGENCY DEPARTMENT Provider Note   CSN: [id number] Arrival date & time: 12/18/18  [id number]     History   Chief Complaint Chief Complaint  Patient presents with  . Routine Prenatal Visit    HPI Joanna Mcpherson is a 32 y.o. female.  Patient is a 32 year old female with history of asthma presenting for possible pregnancy.  She tells me she had a positive pregnancy test last week, but needs documentation of this for her Medicaid.  She has absolutely no symptoms, including abdominal pain, vaginal bleeding, discharge, or other issues.  The history is provided by the patient.    Past Medical History:  Diagnosis Date  . Abnormal Pap smear   . Asthma   . Headache(784.0)   . Hx of migraines     Patient Active Problem List   Diagnosis Date Noted  . Active labor 04/21/2013  . Normal delivery 04/21/2013  . GERD without esophagitis 04/09/2013  . Irregular contractions 03/14/2013    Past Surgical History:  Procedure Laterality Date  . ANKLE FRACTURE SURGERY    . CERVICAL CONE BIOPSY    . elective abortion       OB History    Gravida  3   Para  2   Term  2   Preterm      AB  1   Living  2     SAB      TAB  1   Ectopic      Multiple      Live Births  2            Home Medications    Prior to Admission medications   Medication Sig Start Date End Date Taking? Authorizing Provider  ibuprofen (ADVIL,MOTRIN) 800 MG tablet Take 1 tablet (800 mg total) by mouth every 8 (eight) hours as needed. 05/01/17   Brock BadHarper, Charles A, MD  metroNIDAZOLE (FLAGYL) 500 MG tablet Take 1 tablet (500 mg total) by mouth 2 (two) times daily. 09/27/18   Renne CriglerGeiple, Joshua, PA-C    Family History Family History  Problem Relation Age of Onset  . Diabetes Mother     Social History Social History   Tobacco Use  . Smoking status: Current Some Day Smoker    Packs/day: 0.25    Years: 2.00    Pack years: 0.50    Types: Cigarettes  . Smokeless tobacco:  Never Used  Substance Use Topics  . Alcohol use: Yes    Alcohol/week: 0.0 standard drinks    Comment: occ  . Drug use: No     Allergies   Patient has no known allergies.   Review of Systems Review of Systems  All other systems reviewed and are negative.    Physical Exam Updated Vital Signs Pulse 94   Temp 98.3 F (36.8 C) (Oral)   Resp 16   Ht 5\' 6"  (1.676 m)   Wt 90.7 kg   LMP 11/05/2018   SpO2 100%   BMI 32.28 kg/m   Physical Exam Vitals signs and nursing note reviewed.  Constitutional:      Appearance: Normal appearance.  HENT:     Head: Normocephalic.  Pulmonary:     Effort: Pulmonary effort is normal.  Skin:    General: Skin is warm and dry.  Neurological:     Mental Status: She is alert.      ED Treatments / Results  Labs (all labs ordered are listed, but only abnormal results are displayed) Labs  Reviewed  PREGNANCY, URINE - Abnormal; Notable for the following components:      Result Value   Preg Test, Ur POSITIVE (*)    All other components within normal limits    EKG None  Radiology No results found.  Procedures Procedures (including critical care time)  Medications Ordered in ED Medications - No data to display   Initial Impression / Assessment and Plan / ED Course  I have reviewed the triage vital signs and the nursing notes.  Pertinent labs & imaging results that were available during my care of the patient were reviewed by me and considered in my medical decision making (see chart for details).  Pregnancy test positive.  Documentation of this resolved provided to the patient.  She is not having any pain or discomfort and I see no indication for any emergent testing.  Final Clinical Impressions(s) / ED Diagnoses   Final diagnoses:  None    ED Discharge Orders    None       Geoffery Lyonselo, Mari Battaglia, MD 12/18/18 [phone]

## 2018-12-18 NOTE — ED Triage Notes (Signed)
Pt states she needs a probe that she is pregnant.

## 2018-12-19 NOTE — L&D Delivery Note (Signed)
Delivery Note At 8:41 AM a viable and healthy female was delivered via Vaginal, Spontaneous (Presentation: right occiput; anterior ).  APGAR: 9, 9; weight pending .   Placenta status: spontaneous, intact.  Cord: 3V   Anesthesia:  Epidural  Episiotomy: None Lacerations: None Suture Repair: NA Est. Blood Loss (mL):  150  Mom to postpartum.  Baby to Couplet care / Skin to Skin.  Vanessa Kick 08/15/2019, 8:54 AM

## 2019-01-16 ENCOUNTER — Encounter (HOSPITAL_BASED_OUTPATIENT_CLINIC_OR_DEPARTMENT_OTHER): Payer: Self-pay

## 2019-01-16 ENCOUNTER — Emergency Department (HOSPITAL_BASED_OUTPATIENT_CLINIC_OR_DEPARTMENT_OTHER)
Admission: EM | Admit: 2019-01-16 | Discharge: 2019-01-16 | Disposition: A | Discharge status: Home / Self Care | Payer: Medicaid Other | Attending: Emergency Medicine | Admitting: Emergency Medicine

## 2019-01-16 ENCOUNTER — Other Ambulatory Visit: Payer: Self-pay

## 2019-01-16 DIAGNOSIS — L02214 Cutaneous abscess of groin: Secondary | ICD-10-CM | POA: Insufficient documentation

## 2019-01-16 DIAGNOSIS — Z87891 Personal history of nicotine dependence: Secondary | ICD-10-CM | POA: Insufficient documentation

## 2019-01-16 DIAGNOSIS — J45909 Unspecified asthma, uncomplicated: Secondary | ICD-10-CM | POA: Diagnosis not present

## 2019-01-16 DIAGNOSIS — R1904 Left lower quadrant abdominal swelling, mass and lump: Secondary | ICD-10-CM | POA: Diagnosis present

## 2019-01-16 NOTE — ED Triage Notes (Signed)
C/o left abscess groin x 2 days-NAD-steady gait

## 2019-01-16 NOTE — ED Provider Notes (Signed)
[facility] EMERGENCY DEPARTMENT Provider Note   CSN: [id number] Arrival date & time: 01/16/19  2025     History   Chief Complaint Chief Complaint  Patient presents with  . Abscess    pregnant    HPI Joanna Lamar SprinklesS Waas is a 33 y.o. female.  The history is provided by the patient.  Abscess  Location:  Pelvis Pelvic abscess location:  Groin Size:  Quarter sized Abscess quality: fluctuance, induration and painful   Abscess quality: not draining   Red streaking: no   Duration:  3 days Progression:  Worsening Pain details:    Quality:  Aching, pressure, throbbing and shooting   Severity:  Moderate   Timing:  Constant   Progression:  Worsening Chronicity:  New Context comment:  Pt waxes this area and did not exfoliate well this month Relieved by:  Nothing Worsened by:  Draining/squeezing Ineffective treatments:  None tried Associated symptoms: no anorexia, no fatigue, no nausea and no vomiting   Risk factors: no hx of MRSA and no prior abscess     Past Medical History:  Diagnosis Date  . Abnormal Pap smear   . Asthma   . Headache(784.0)   . Hx of migraines     Patient Active Problem List   Diagnosis Date Noted  . Active labor 04/21/2013  . Normal delivery 04/21/2013  . GERD without esophagitis 04/09/2013  . Irregular contractions 03/14/2013    Past Surgical History:  Procedure Laterality Date  . ANKLE FRACTURE SURGERY    . CERVICAL CONE BIOPSY    . elective abortion       OB History    Gravida  4   Para  2   Term  2   Preterm      AB  1   Living  2     SAB      TAB  1   Ectopic      Multiple      Live Births  2            Home Medications    Prior to Admission medications   Medication Sig Start Date End Date Taking? Authorizing Provider  ibuprofen (ADVIL,MOTRIN) 800 MG tablet Take 1 tablet (800 mg total) by mouth every 8 (eight) hours as needed. 05/01/17   Brock BadHarper, Charles A, MD  metroNIDAZOLE (FLAGYL) 500 MG tablet  Take 1 tablet (500 mg total) by mouth 2 (two) times daily. 09/27/18   Renne CriglerGeiple, Joshua, PA-C    Family History Family History  Problem Relation Age of Onset  . Diabetes Mother     Social History Social History   Tobacco Use  . Smoking status: Former Games developermoker  . Smokeless tobacco: Never Used  Substance Use Topics  . Alcohol use: Not Currently    Alcohol/week: 0.0 standard drinks  . Drug use: No     Allergies   Patient has no known allergies.   Review of Systems Review of Systems  Constitutional: Negative for fatigue.  Gastrointestinal: Negative for anorexia, nausea and vomiting.  All other systems reviewed and are negative.    Physical Exam Updated Vital Signs BP 116/72 (BP Location: Right Arm)   Pulse 98   Temp 98.2 F (36.8 C) (Oral)   Resp 18   Ht 5\' 6"  (1.676 m)   Wt 98.4 kg   LMP 11/05/2018   SpO2 99%   BMI 35.02 kg/m   Physical Exam Vitals signs and nursing note reviewed.  Constitutional:  Appearance: Normal appearance.  HENT:     Head: Normocephalic.  Cardiovascular:     Rate and Rhythm: Normal rate.  Pulmonary:     Effort: Pulmonary effort is normal.  Skin:    Capillary Refill: Capillary refill takes less than 2 seconds.       Neurological:     Mental Status: She is alert. Mental status is at baseline.  Psychiatric:        Mood and Affect: Mood normal.      ED Treatments / Results  Labs (all labs ordered are listed, but only abnormal results are displayed) Labs Reviewed - No data to display  EKG None  Radiology No results found.  Procedures Procedures (including critical care time)  INCISION AND DRAINAGE Performed by: Gwyneth SproutWhitney Kerra Guilfoil Consent: Verbal consent obtained. Risks and benefits: risks, benefits and alternatives were discussed Type: abscess  Body area: left groin  Anesthesia: local infiltration  Incision was made with a scalpel.  Local anesthetic: lidocaine 1% with epinephrine  Anesthetic total: 3  ml  Complexity:simple Drainage: purulent  Drainage amount: 4mL  Packing material: none Patient tolerance: Patient tolerated the procedure well with no immediate complications.     Medications Ordered in ED Medications - No data to display   Initial Impression / Assessment and Plan / ED Course  I have reviewed the triage vital signs and the nursing notes.  Pertinent labs & imaging results that were available during my care of the patient were reviewed by me and considered in my medical decision making (see chart for details).     Patient with symptoms most consistent with an abscess in her left groin from an ingrown hair.  She has no signs of surrounding cellulitis.  She does happen to be approximately [redacted] weeks pregnant by dates but has no complaints about that at this time.  She is planning on following up with OB/GYN.  Final Clinical Impressions(s) / ED Diagnoses   Final diagnoses:  Abscess of left groin    ED Discharge Orders    None       Gwyneth SproutPlunkett, Elizah Mierzwa, MD 01/16/19 2300

## 2019-02-27 LAB — OB RESULTS CONSOLE HEPATITIS B SURFACE ANTIGEN: Hepatitis B Surface Ag: NEGATIVE

## 2019-02-27 LAB — OB RESULTS CONSOLE RUBELLA ANTIBODY, IGM: Rubella: IMMUNE

## 2019-02-27 LAB — OB RESULTS CONSOLE GC/CHLAMYDIA
Chlamydia: NEGATIVE
Gonorrhea: NEGATIVE

## 2019-02-27 LAB — OB RESULTS CONSOLE HIV ANTIBODY (ROUTINE TESTING): HIV: NONREACTIVE

## 2019-02-27 LAB — OB RESULTS CONSOLE RPR: RPR: NONREACTIVE

## 2019-07-11 ENCOUNTER — Encounter (HOSPITAL_COMMUNITY): Payer: Self-pay | Admitting: *Deleted

## 2019-07-11 ENCOUNTER — Inpatient Hospital Stay (HOSPITAL_COMMUNITY)
Admission: AD | Admit: 2019-07-11 | Discharge: 2019-07-11 | Disposition: A | Discharge status: Home / Self Care | Payer: BC Managed Care – PPO | Attending: Obstetrics | Admitting: Obstetrics

## 2019-07-11 ENCOUNTER — Other Ambulatory Visit: Payer: Self-pay

## 2019-07-11 DIAGNOSIS — O99513 Diseases of the respiratory system complicating pregnancy, third trimester: Secondary | ICD-10-CM | POA: Diagnosis not present

## 2019-07-11 DIAGNOSIS — Z3689 Encounter for other specified antenatal screening: Secondary | ICD-10-CM

## 2019-07-11 DIAGNOSIS — O368130 Decreased fetal movements, third trimester, not applicable or unspecified: Secondary | ICD-10-CM | POA: Insufficient documentation

## 2019-07-11 DIAGNOSIS — Z87891 Personal history of nicotine dependence: Secondary | ICD-10-CM | POA: Diagnosis not present

## 2019-07-11 DIAGNOSIS — Z792 Long term (current) use of antibiotics: Secondary | ICD-10-CM | POA: Insufficient documentation

## 2019-07-11 DIAGNOSIS — O212 Late vomiting of pregnancy: Secondary | ICD-10-CM | POA: Insufficient documentation

## 2019-07-11 DIAGNOSIS — J45909 Unspecified asthma, uncomplicated: Secondary | ICD-10-CM | POA: Insufficient documentation

## 2019-07-11 DIAGNOSIS — Z3A35 35 weeks gestation of pregnancy: Secondary | ICD-10-CM

## 2019-07-11 DIAGNOSIS — Z833 Family history of diabetes mellitus: Secondary | ICD-10-CM | POA: Diagnosis not present

## 2019-07-11 DIAGNOSIS — O219 Vomiting of pregnancy, unspecified: Secondary | ICD-10-CM | POA: Diagnosis not present

## 2019-07-11 LAB — URINALYSIS, ROUTINE W REFLEX MICROSCOPIC
Bilirubin Urine: NEGATIVE
Glucose, UA: NEGATIVE mg/dL
Hgb urine dipstick: NEGATIVE
Ketones, ur: NEGATIVE mg/dL
Nitrite: NEGATIVE
Protein, ur: NEGATIVE mg/dL
Specific Gravity, Urine: 1.003 — ABNORMAL LOW (ref 1.005–1.030)
pH: 8 (ref 5.0–8.0)

## 2019-07-11 MED ORDER — ONDANSETRON 4 MG PO TBDP: 4.0000 mg | ORAL_TABLET | Freq: Four times a day (QID) | ORAL | 0 refills | Status: DC | PRN

## 2019-07-11 NOTE — MAU Note (Signed)
For the past 2 days, hasn't really been able to keep anything down, not even water and her movements have really slowed down.  It's scaring her.  No one at home is sick, denies fever or diarrhea.  No pain, just pelvic pressure.

## 2019-07-11 NOTE — Discharge Instructions (Signed)
Morning Sickness  Morning sickness is when you feel sick to your stomach (nauseous) during pregnancy. You may feel sick to your stomach and throw up (vomit). You may feel sick in the morning, but you can feel this way at any time of day. Some women feel very sick to their stomach and cannot stop throwing up (hyperemesis gravidarum). Follow these instructions at home: Medicines  Take over-the-counter and prescription medicines only as told by your doctor. Do not take any medicines until you talk with your doctor about them first.  Taking multivitamins before getting pregnant can stop or lessen the harshness of morning sickness. Eating and drinking  Eat dry toast or crackers before getting out of bed.  Eat 5 or 6 small meals a day.  Eat dry and bland foods like rice and baked potatoes.  Do not eat greasy, fatty, or spicy foods.  Have someone cook for you if the smell of food causes you to feel sick or throw up.  If you feel sick to your stomach after taking prenatal vitamins, take them at night or with a snack.  Eat protein when you need a snack. Nuts, yogurt, and cheese are good choices.  Drink fluids throughout the day.  Try ginger ale made with real ginger, ginger tea made from fresh grated ginger, or ginger candies. General instructions  Do not use any products that have nicotine or tobacco in them, such as cigarettes and e-cigarettes. If you need help quitting, ask your doctor.  Use an air purifier to keep the air in your house free of smells.  Get lots of fresh air.  Try to avoid smells that make you feel sick.  Try: ? Wearing a bracelet that is used for seasickness (acupressure wristband). ? Going to a doctor who puts thin needles into certain body points (acupuncture) to improve how you feel. Contact a doctor if:  You need medicine to feel better.  You feel dizzy or light-headed.  You are losing weight. Get help right away if:  You feel very sick to your  stomach and cannot stop throwing up.  You pass out (faint).  You have very bad pain in your belly. Summary  Morning sickness is when you feel sick to your stomach (nauseous) during pregnancy.  You may feel sick in the morning, but you can feel this way at any time of day.  Making some changes to what you eat may help your symptoms go away. This information is not intended to replace advice given to you by your health care provider. Make sure you discuss any questions you have with your health care provider. Document Released: 01/12/2005 Document Revised: 11/17/2017 Document Reviewed: 01/05/2017 Elsevier Patient Education  2020 Elsevier Inc.  

## 2019-07-11 NOTE — MAU Provider Note (Signed)
History     CSN: [id number]  Arrival date and time: 07/11/19 1442   First Provider Initiated Contact with Patient 07/11/19 1513      Chief Complaint  Patient presents with  . Emesis   HPI Joanna Mcpherson is a 33 y.o. [phone] at [redacted]w[redacted]d who presents to MAU with chief complaints of nausea, vomiting and decreased fetal movement. She denies vaginal bleeding, abdominal pain, leaking of fluid, fever, falls, or recent illness.   Nausea and Vomiting This is a new problem, onset within the past two days. Patient states she is unable to tolerate anything PO.  She reports generalized nausea throughout the day with occasional episodes of vomiting associated with certain smell or food-specific triggers.She denies fever, sore throat, SOB, cough, diarrhea, exposure to illness.  DFM This is a new problem, onset today. Patient states her baby typically moves all day but movements have significantly decreased in frequency. She has attempted to facilitate fetal movement with cold beverages and resting on her side but was unsuccessful. She is able to detect very active movement upon arrival in MAU.   OB History    Gravida  4   Para  2   Term  2   Preterm      AB  1   Living  2     SAB      TAB  1   Ectopic      Multiple      Live Births  2           Past Medical History:  Diagnosis Date  . Abnormal Pap smear   . Asthma   . Headache(784.0)   . Hx of migraines     Past Surgical History:  Procedure Laterality Date  . ANKLE FRACTURE SURGERY    . CERVICAL CONE BIOPSY    . elective abortion      Family History  Problem Relation Age of Onset  . Diabetes Mother     Social History   Tobacco Use  . Smoking status: Former Research scientist (life sciences)  . Smokeless tobacco: Never Used  Substance Use Topics  . Alcohol use: Not Currently    Alcohol/week: 0.0 standard drinks  . Drug use: No    Allergies: No Known Allergies  Medications Prior to Admission  Medication Sig Dispense Refill  Last Dose  . acetaminophen (TYLENOL) 500 MG tablet Take 500 mg by mouth every 6 (six) hours as needed.   Past Week at Unknown time  . famotidine (PEPCID) 20 MG tablet Take 20 mg by mouth 2 (two) times daily.   07/10/2019 at Unknown time  . Prenatal Vit-Fe Fumarate-FA (MULTIVITAMIN-PRENATAL) 27-0.8 MG TABS tablet Take 1 tablet by mouth daily at 12 noon.   07/10/2019 at Unknown time  . ibuprofen (ADVIL,MOTRIN) 800 MG tablet Take 1 tablet (800 mg total) by mouth every 8 (eight) hours as needed. 40 tablet 5   . metroNIDAZOLE (FLAGYL) 500 MG tablet Take 1 tablet (500 mg total) by mouth 2 (two) times daily. 14 tablet 0     Review of Systems  Constitutional: Negative for chills, fatigue and fever.  Gastrointestinal: Positive for nausea and vomiting. Negative for abdominal pain.  Musculoskeletal: Negative for back pain.  Neurological: Negative for headaches.  All other systems reviewed and are negative.  Physical Exam   Blood pressure 111/66, pulse 98, temperature 98.5 F (36.9 C), temperature source Oral, resp. rate 18, weight 97.1 kg, last menstrual period 11/05/2018, SpO2 98 %.  Physical Exam  Nursing  note and vitals reviewed. Constitutional: She is oriented to person, place, and time. She appears well-developed and well-nourished.  Eyes: Conjunctivae are normal.  Respiratory: Effort normal. No respiratory distress.  GI: She exhibits no distension. There is no abdominal tenderness. There is no rebound and no guarding.  Gravid  Neurological: She is alert and oriented to person, place, and time.  Skin: Skin is warm and dry. No pallor.  Psychiatric: She has a normal mood and affect. Her behavior is normal. Judgment and thought content normal.    MAU Course/MDM  Procedures  --Patient offered short course of Zofran sublingual in addition to diet modification to assist with smell and food-specific triggers. --Slightly abnormal UA, asymptomatic. Urine culture ordered --Reactive tracing:  baseline 135, moderate variability, positive accels, no decels --Toco: quiet --Cervical exam not indicated based on chief complaints. Patient agreeable.  Patient Vitals for the past 24 hrs:  BP Temp Temp src Pulse Resp SpO2 Weight  07/11/19 1611 114/67 - - 85 - - -  07/11/19 1459 111/66 98.5 F (36.9 C) Oral 98 18 98 % 97.1 kg   Results for orders placed or performed during the hospital encounter of 07/11/19 (from the past 24 hour(s))  Urinalysis, Routine w reflex microscopic     Status: Abnormal   Collection Time: 07/11/19  3:29 PM  Result Value Ref Range   Color, Urine STRAW (A) YELLOW   APPearance CLEAR CLEAR   Specific Gravity, Urine 1.003 (L) 1.005 - 1.030   pH 8.0 5.0 - 8.0   Glucose, UA NEGATIVE NEGATIVE mg/dL   Hgb urine dipstick NEGATIVE NEGATIVE   Bilirubin Urine NEGATIVE NEGATIVE   Ketones, ur NEGATIVE NEGATIVE mg/dL   Protein, ur NEGATIVE NEGATIVE mg/dL   Nitrite NEGATIVE NEGATIVE   Leukocytes,Ua TRACE (A) NEGATIVE   RBC / HPF 0-5 0 - 5 RBC/hpf   WBC, UA 0-5 0 - 5 WBC/hpf   Bacteria, UA RARE (A) NONE SEEN   Squamous Epithelial / LPF 0-5 0 - 5  Culture, OB Urine     Status: None (Preliminary result)   Collection Time: 07/11/19  3:29 PM   Specimen: Urine, Random  Result Value Ref Range   Specimen Description URINE, RANDOM    Special Requests      NONE Performed at [facility], [address] [place], [place] [address]    Culture PENDING    Report Status PENDING    Meds ordered this encounter  Medications  . ondansetron (ZOFRAN ODT) 4 MG disintegrating tablet    Sig: Take 1 tablet (4 mg total) by mouth every 6 (six) hours as needed for nausea.    Dispense:  20 tablet    Refill:  0    Order Specific Question:   Supervising Provider    Answer:   Adam PhenixARNOLD, JAMES G [[id number]]   Assessment and Plan  --33 y.o. [phone] [id number] at 2770w3d  --Reactive tracing --Nausea/vomiting, short course rx Zofran --Discharge home in stable condition  F/U: Patient's next ob appt is  07/17/19  Calvert CantorSamantha C Kylar Leonhardt, CNM 07/11/2019, 5:03 PM

## 2019-07-12 LAB — CULTURE, OB URINE: Culture: NO GROWTH

## 2019-07-17 LAB — OB RESULTS CONSOLE GBS: GBS: NEGATIVE

## 2019-08-10 ENCOUNTER — Inpatient Hospital Stay (HOSPITAL_COMMUNITY)
Admission: AD | Admit: 2019-08-10 | Discharge: 2019-08-11 | Disposition: A | Discharge status: Home / Self Care | Payer: BC Managed Care – PPO | Source: Ambulatory Visit | Attending: Obstetrics and Gynecology | Admitting: Obstetrics and Gynecology

## 2019-08-10 ENCOUNTER — Other Ambulatory Visit: Payer: Self-pay

## 2019-08-10 ENCOUNTER — Encounter (HOSPITAL_COMMUNITY): Payer: Self-pay

## 2019-08-10 DIAGNOSIS — Z87891 Personal history of nicotine dependence: Secondary | ICD-10-CM | POA: Insufficient documentation

## 2019-08-10 DIAGNOSIS — O288 Other abnormal findings on antenatal screening of mother: Secondary | ICD-10-CM

## 2019-08-10 DIAGNOSIS — O471 False labor at or after 37 completed weeks of gestation: Secondary | ICD-10-CM | POA: Insufficient documentation

## 2019-08-10 DIAGNOSIS — O368130 Decreased fetal movements, third trimester, not applicable or unspecified: Secondary | ICD-10-CM

## 2019-08-10 DIAGNOSIS — O99513 Diseases of the respiratory system complicating pregnancy, third trimester: Secondary | ICD-10-CM | POA: Insufficient documentation

## 2019-08-10 DIAGNOSIS — J45909 Unspecified asthma, uncomplicated: Secondary | ICD-10-CM | POA: Insufficient documentation

## 2019-08-10 DIAGNOSIS — Z3A39 39 weeks gestation of pregnancy: Secondary | ICD-10-CM | POA: Insufficient documentation

## 2019-08-10 DIAGNOSIS — Z833 Family history of diabetes mellitus: Secondary | ICD-10-CM | POA: Insufficient documentation

## 2019-08-10 DIAGNOSIS — O479 False labor, unspecified: Secondary | ICD-10-CM

## 2019-08-10 NOTE — MAU Provider Note (Addendum)
History     CSN: [id number]  Arrival date and time: 08/10/19 2774   First Provider Initiated Contact with Patient 08/10/19 2009      Chief Complaint  Patient presents with  . Decreased Fetal Movement   Joanna Mcpherson is a 33 y.o. [id number] at [redacted]w[redacted]d who receives care at [facility].  She presents today for Decreased Fetal Movement.  She states she had not felt movement since 4pm, but has felt movement since arrival greater than 5 times.  Patient endorses contractions and pelvic pressure.  She states they have increased in intensity and frequency since arrival and rates the contraction pain at a 5/10.  She reports that she is unsure of whether she will receive an epidural or not, but declines current pain medication.  She reports vaginal spotting, but states she had an exam yesterday and her membranes were stripped.  Patient denies other vaginal concerns including discharge and leaking.       OB History    Gravida  4   Para  2   Term  2   Preterm      AB  1   Living  2     SAB      TAB  1   Ectopic      Multiple      Live Births  2           Past Medical History:  Diagnosis Date  . Abnormal Pap smear   . Asthma   . Headache(784.0)   . Hx of migraines     Past Surgical History:  Procedure Laterality Date  . ANKLE FRACTURE SURGERY    . CERVICAL CONE BIOPSY    . elective abortion      Family History  Problem Relation Age of Onset  . Diabetes Mother     Social History   Tobacco Use  . Smoking status: Former Research scientist (life sciences)  . Smokeless tobacco: Never Used  Substance Use Topics  . Alcohol use: Not Currently    Alcohol/week: 0.0 standard drinks  . Drug use: No    Allergies: No Known Allergies  Medications Prior to Admission  Medication Sig Dispense Refill Last Dose  . acetaminophen (TYLENOL) 500 MG tablet Take 500 mg by mouth every 6 (six) hours as needed.   Past Week at Unknown time  . famotidine (PEPCID) 20 MG tablet Take 20 mg by mouth 2  (two) times daily.   08/09/2019 at Unknown time  . ondansetron (ZOFRAN ODT) 4 MG disintegrating tablet Take 1 tablet (4 mg total) by mouth every 6 (six) hours as needed for nausea. 20 tablet 0 Past Month at Unknown time  . Prenatal Vit-Fe Fumarate-FA (MULTIVITAMIN-PRENATAL) 27-0.8 MG TABS tablet Take 1 tablet by mouth daily at 12 noon.   08/09/2019 at Unknown time  . metroNIDAZOLE (FLAGYL) 500 MG tablet Take 1 tablet (500 mg total) by mouth 2 (two) times daily. 14 tablet 0     Review of Systems  Constitutional: Negative for chills and fever.  Respiratory: Negative for cough and shortness of breath.   Gastrointestinal: Negative for abdominal pain, constipation, diarrhea, nausea and vomiting.  Genitourinary: Negative for difficulty urinating, dysuria, pelvic pain (Pressure), vaginal bleeding (Spotting yesterday) and vaginal discharge.  Musculoskeletal: Positive for back pain (Lower back).  Neurological: Negative for dizziness, light-headedness and headaches.   Physical Exam   Blood pressure 114/68, pulse 88, temperature 98.5 F (36.9 C), temperature source Oral, resp. rate 18, height 5\' 6"  (1.676 m),  weight 96.6 kg, last menstrual period 11/05/2018, SpO2 98 %.  Physical Exam  Constitutional: She is oriented to person, place, and time. She appears well-developed and well-nourished.  HENT:  Head: Normocephalic and atraumatic.  Eyes: Conjunctivae are normal.  Neck: Normal range of motion.  Cardiovascular: Normal rate.  Respiratory: Effort normal.  GI: Soft. There is no abdominal tenderness.  Musculoskeletal: Normal range of motion.  Neurological: She is alert and oriented to person, place, and time.  Skin: Skin is warm and dry.  Psychiatric: She has a normal mood and affect. Her behavior is normal.    Fetal Assessment 135 bpm, Mod Var, + Early Decels, +Accels Toco: Q1-372min, palpates moderate  MAU Course  No results found for this or any previous visit (from the past 24 hour(s)). No  results found.  MDM PE Labs: EFM  Assessment and Plan  33 year old [id number]  SIUP at 39.5weeks Cat I FT DFM-Resolved Contractions   -Exam findings discussed. -Patient reports reassurance with fetal movement.  -Patient given option for cervical exam and monitoring; agrees. -Will have nurse perform exam and reassess in 1-2 hours. -Patient without further questions or concerns.  Dilation: 2 Effacement (%): Thick Cervical Position: Posterior Station: -3 Presentation: Vertex Exam by:: Camelia Enganielle Simpson RN   Joanna RobinsJessica L Sorrel Cassetta MSN, CNM 08/10/2019, 8:09 PM    Reassessment (10:33 PM)  -Patient reports contractions have decreased. -Continues to endorse fetal movement. -NST remains reactive. -VE remains the same   Dilation: 2 Effacement (%): Thick Cervical Position: Posterior Station: -3 Presentation: Vertex Exam by:: Joanna Mcpherson, CNM  -Will discharge to home. -Labor precautions given. -Encouraged to call or return to MAU if symptoms worsen or with the onset of new symptoms. -Discharged to home in stable condition.  Joanna RobinsJessica L Bernita Beckstrom MSN, CNM 08/10/2019

## 2019-08-10 NOTE — MAU Note (Signed)
Pt here for DFM. Last felt baby move around 4pm. Tried to eat and drink, but still hasn't felt baby move. FHR 130 in triage. Pt also reporting some mild contractions that started while she was in the lobby. Denies LOF or vaginal bleeding. Had a membrane sweep yesterday and cervix was 2cm.

## 2019-08-10 NOTE — Discharge Instructions (Signed)

## 2019-08-11 DIAGNOSIS — Z3A39 39 weeks gestation of pregnancy: Secondary | ICD-10-CM | POA: Diagnosis not present

## 2019-08-11 DIAGNOSIS — O288 Other abnormal findings on antenatal screening of mother: Secondary | ICD-10-CM | POA: Diagnosis not present

## 2019-08-11 DIAGNOSIS — O99513 Diseases of the respiratory system complicating pregnancy, third trimester: Secondary | ICD-10-CM | POA: Diagnosis not present

## 2019-08-11 DIAGNOSIS — Z87891 Personal history of nicotine dependence: Secondary | ICD-10-CM | POA: Diagnosis not present

## 2019-08-11 DIAGNOSIS — O368130 Decreased fetal movements, third trimester, not applicable or unspecified: Secondary | ICD-10-CM | POA: Diagnosis not present

## 2019-08-11 DIAGNOSIS — O471 False labor at or after 37 completed weeks of gestation: Secondary | ICD-10-CM | POA: Diagnosis not present

## 2019-08-11 DIAGNOSIS — Z833 Family history of diabetes mellitus: Secondary | ICD-10-CM | POA: Diagnosis not present

## 2019-08-11 DIAGNOSIS — J45909 Unspecified asthma, uncomplicated: Secondary | ICD-10-CM | POA: Diagnosis not present

## 2019-08-13 ENCOUNTER — Inpatient Hospital Stay (EMERGENCY_DEPARTMENT_HOSPITAL)
Admission: AD | Admit: 2019-08-13 | Discharge: 2019-08-14 | Disposition: A | Discharge status: Home / Self Care | Payer: BC Managed Care – PPO | Source: Home / Self Care | Attending: Obstetrics | Admitting: Obstetrics

## 2019-08-13 ENCOUNTER — Encounter (HOSPITAL_COMMUNITY): Payer: Self-pay

## 2019-08-13 ENCOUNTER — Other Ambulatory Visit: Payer: Self-pay

## 2019-08-13 DIAGNOSIS — O479 False labor, unspecified: Secondary | ICD-10-CM

## 2019-08-13 DIAGNOSIS — Z3689 Encounter for other specified antenatal screening: Secondary | ICD-10-CM

## 2019-08-13 NOTE — MAU Note (Signed)
PT SAYS UC'S STRONG SINCE 5PM. [place]- VE  2 CM ON  Friday.  DENIES HSV AND MRSA. GBS-  NEG

## 2019-08-14 DIAGNOSIS — O480 Post-term pregnancy: Secondary | ICD-10-CM

## 2019-08-14 NOTE — Discharge Instructions (Signed)
Braxton Hicks Contractions Contractions of the uterus can occur throughout pregnancy, but they are not always a sign that you are in labor. You may have practice contractions called Braxton Hicks contractions. These false labor contractions are sometimes confused with true labor. What are Braxton Hicks contractions? Braxton Hicks contractions are tightening movements that occur in the muscles of the uterus before labor. Unlike true labor contractions, these contractions do not result in opening (dilation) and thinning of the cervix. Toward the end of pregnancy (32-34 weeks), Braxton Hicks contractions can happen more often and may become stronger. These contractions are sometimes difficult to tell apart from true labor because they can be very uncomfortable. You should not feel embarrassed if you go to the hospital with false labor. Sometimes, the only way to tell if you are in true labor is for your health care provider to look for changes in the cervix. The health care provider will do a physical exam and may monitor your contractions. If you are not in true labor, the exam should show that your cervix is not dilating and your water has not broken. If there are no other health problems associated with your pregnancy, it is completely safe for you to be sent home with false labor. You may continue to have Braxton Hicks contractions until you go into true labor. How to tell the difference between true labor and false labor True labor  Contractions last 30-70 seconds.  Contractions become very regular.  Discomfort is usually felt in the top of the uterus, and it spreads to the lower abdomen and low back.  Contractions do not go away with walking.  Contractions usually become more intense and increase in frequency.  The cervix dilates and gets thinner. False labor  Contractions are usually shorter and not as strong as true labor contractions.  Contractions are usually irregular.  Contractions  are often felt in the front of the lower abdomen and in the groin.  Contractions may go away when you walk around or change positions while lying down.  Contractions get weaker and are shorter-lasting as time goes on.  The cervix usually does not dilate or become thin. Follow these instructions at home:   Take over-the-counter and prescription medicines only as told by your health care provider.  Keep up with your usual exercises and follow other instructions from your health care provider.  Eat and drink lightly if you think you are going into labor.  If Braxton Hicks contractions are making you uncomfortable: ? Change your position from lying down or resting to walking, or change from walking to resting. ? Sit and rest in a tub of warm water. ? Drink enough fluid to keep your urine pale yellow. Dehydration may cause these contractions. ? Do slow and deep breathing several times an hour.  Keep all follow-up prenatal visits as told by your health care provider. This is important. Contact a health care provider if:  You have a fever.  You have continuous pain in your abdomen. Get help right away if:  Your contractions become stronger, more regular, and closer together.  You have fluid leaking or gushing from your vagina.  You pass blood-tinged mucus (bloody show).  You have bleeding from your vagina.  You have low back pain that you never had before.  You feel your baby's head pushing down and causing pelvic pressure.  Your baby is not moving inside you as much as it used to. Summary  Contractions that occur before labor are   called Braxton Hicks contractions, false labor, or practice contractions.  Braxton Hicks contractions are usually shorter, weaker, farther apart, and less regular than true labor contractions. True labor contractions usually become progressively stronger and regular, and they become more frequent.  Manage discomfort from Braxton Hicks contractions  by changing position, resting in a warm bath, drinking plenty of water, or practicing deep breathing. This information is not intended to replace advice given to you by your health care provider. Make sure you discuss any questions you have with your health care provider. Document Released: 04/20/2017 Document Revised: 11/17/2017 Document Reviewed: 04/20/2017 Elsevier Patient Education  2020 Elsevier Inc.  

## 2019-08-14 NOTE — MAU Note (Signed)
I have communicated with Hansel Feinstein CNM and reviewed vital signs:  Vitals:   08/13/19 2226 08/14/19 0008  BP: 107/74 114/72  Pulse: (!) 108 90  Resp: 20   Temp: 98.4 F (36.9 C)     Vaginal exam:  Dilation: 2 Effacement (%): 50 Station: -3 Presentation: Vertex Exam by:: [employer] RN,   Also reviewed contraction pattern and that non-stress test is reactive.  It has been documented that patient is contracting every 3-9 minutes with no cervical change over 1.25 hours not indicating active labor.  Patient denies any other complaints.  Based on this report provider has given order for discharge.  A discharge order and diagnosis entered by a provider.   Labor discharge instructions reviewed with patient.

## 2019-08-14 NOTE — MAU Provider Note (Signed)
S: Ms. MERION CATON is a 33 y.o. [phone] at [redacted]w[redacted]d  who presents to MAU today for labor evaluation.     Cervical exam by RN:  Dilation: 2 Effacement (%): 50 Station: -3 Presentation: Vertex Exam by:: Arts development officer  Fetal Monitoring: Baseline: 140 Variability: average Accelerations: present Decelerations: absent Contractions: irregular  MDM Discussed patient with RN. NST reviewed.   A: SIUP at [redacted]w[redacted]d  False labor  P: Discharge home Labor precautions and kick counts included in AVS Patient to follow-up with office as scheduled  Patient may return to MAU as needed or when in labor   Seabron Spates, [place] 08/14/2019 12:20 AM

## 2019-08-15 ENCOUNTER — Encounter (HOSPITAL_COMMUNITY): Payer: Self-pay | Admitting: *Deleted

## 2019-08-15 ENCOUNTER — Other Ambulatory Visit: Payer: Self-pay

## 2019-08-15 ENCOUNTER — Inpatient Hospital Stay (HOSPITAL_COMMUNITY): Payer: BC Managed Care – PPO | Admitting: Anesthesiology

## 2019-08-15 ENCOUNTER — Inpatient Hospital Stay (HOSPITAL_COMMUNITY)
Admission: AD | Admit: 2019-08-15 | Discharge: 2019-08-17 | DRG: 807 | Disposition: A | Discharge status: Home / Self Care | Payer: BC Managed Care – PPO | Attending: Obstetrics and Gynecology | Admitting: Obstetrics and Gynecology

## 2019-08-15 DIAGNOSIS — O26893 Other specified pregnancy related conditions, third trimester: Secondary | ICD-10-CM | POA: Diagnosis present

## 2019-08-15 DIAGNOSIS — Z20828 Contact with and (suspected) exposure to other viral communicable diseases: Secondary | ICD-10-CM | POA: Diagnosis present

## 2019-08-15 DIAGNOSIS — Z3490 Encounter for supervision of normal pregnancy, unspecified, unspecified trimester: Secondary | ICD-10-CM

## 2019-08-15 DIAGNOSIS — Z87891 Personal history of nicotine dependence: Secondary | ICD-10-CM | POA: Diagnosis not present

## 2019-08-15 LAB — CBC
HCT: 40.9 % (ref 36.0–46.0)
Hemoglobin: 13.9 g/dL (ref 12.0–15.0)
MCH: 31.9 pg (ref 26.0–34.0)
MCHC: 34 g/dL (ref 30.0–36.0)
MCV: 93.8 fL (ref 80.0–100.0)
Platelets: 142 10*3/uL — ABNORMAL LOW (ref 150–400)
RBC: 4.36 MIL/uL (ref 3.87–5.11)
RDW: 13.9 % (ref 11.5–15.5)
WBC: 6.4 10*3/uL (ref 4.0–10.5)
nRBC: 0 % (ref 0.0–0.2)

## 2019-08-15 LAB — SARS CORONAVIRUS 2 BY RT PCR (HOSPITAL ORDER, PERFORMED IN ~~LOC~~ HOSPITAL LAB): SARS Coronavirus 2: NEGATIVE

## 2019-08-15 LAB — RPR: RPR Ser Ql: NONREACTIVE

## 2019-08-15 LAB — TYPE AND SCREEN
ABO/RH(D): O POS
Antibody Screen: NEGATIVE

## 2019-08-15 LAB — ABO/RH: ABO/RH(D): O POS

## 2019-08-15 LAB — POCT FERN TEST: POCT Fern Test: POSITIVE

## 2019-08-15 MED ORDER — OXYCODONE-ACETAMINOPHEN 5-325 MG PO TABS: 1.0000 | ORAL_TABLET | ORAL | Status: DC | PRN

## 2019-08-15 MED ORDER — METHYLERGONOVINE MALEATE 0.2 MG/ML IJ SOLN: 0.2000 mg | INTRAMUSCULAR | Status: DC | PRN

## 2019-08-15 MED ORDER — LACTATED RINGERS IV SOLN: 500.0000 mL | INTRAVENOUS | Status: DC | PRN

## 2019-08-15 MED ORDER — IBUPROFEN 600 MG PO TABS
600.0000 mg | ORAL_TABLET | Freq: Four times a day (QID) | ORAL | Status: DC
  Administered 2019-08-15 – 2019-08-17 (×8): 600 mg via ORAL
  Filled 2019-08-15 (×8): qty 1

## 2019-08-15 MED ORDER — SOD CITRATE-CITRIC ACID 500-334 MG/5ML PO SOLN: 30.0000 mL | ORAL | Status: DC | PRN

## 2019-08-15 MED ORDER — FENTANYL-BUPIVACAINE-NACL 0.5-0.125-0.9 MG/250ML-% EP SOLN
12.0000 mL/h | EPIDURAL | Status: DC | PRN
  Filled 2019-08-15: qty 250

## 2019-08-15 MED ORDER — BENZOCAINE-MENTHOL 20-0.5 % EX AERO
1.0000 "application " | INHALATION_SPRAY | CUTANEOUS | Status: DC | PRN
  Administered 2019-08-15: 1 via TOPICAL
  Filled 2019-08-15: qty 56

## 2019-08-15 MED ORDER — ONDANSETRON HCL 4 MG/2ML IJ SOLN: 4.0000 mg | Freq: Four times a day (QID) | INTRAMUSCULAR | Status: DC | PRN

## 2019-08-15 MED ORDER — ONDANSETRON HCL 4 MG/2ML IJ SOLN: 4.0000 mg | INTRAMUSCULAR | Status: DC | PRN

## 2019-08-15 MED ORDER — WITCH HAZEL-GLYCERIN EX PADS: 1.0000 "application " | MEDICATED_PAD | CUTANEOUS | Status: DC | PRN

## 2019-08-15 MED ORDER — PHENYLEPHRINE 40 MCG/ML (10ML) SYRINGE FOR IV PUSH (FOR BLOOD PRESSURE SUPPORT): 80.0000 ug | PREFILLED_SYRINGE | INTRAVENOUS | Status: DC | PRN

## 2019-08-15 MED ORDER — LIDOCAINE HCL (PF) 1 % IJ SOLN: 30.0000 mL | INTRAMUSCULAR | Status: DC | PRN

## 2019-08-15 MED ORDER — COCONUT OIL OIL: 1.0000 "application " | TOPICAL_OIL | Status: DC | PRN

## 2019-08-15 MED ORDER — ONDANSETRON HCL 4 MG PO TABS: 4.0000 mg | ORAL_TABLET | ORAL | Status: DC | PRN

## 2019-08-15 MED ORDER — ACETAMINOPHEN 325 MG PO TABS
650.0000 mg | ORAL_TABLET | ORAL | Status: DC | PRN
  Administered 2019-08-15 – 2019-08-16 (×3): 650 mg via ORAL
  Filled 2019-08-15 (×3): qty 2

## 2019-08-15 MED ORDER — TETANUS-DIPHTH-ACELL PERTUSSIS 5-2.5-18.5 LF-MCG/0.5 IM SUSP: 0.5000 mL | Freq: Once | INTRAMUSCULAR | Status: DC

## 2019-08-15 MED ORDER — ZOLPIDEM TARTRATE 5 MG PO TABS: 5.0000 mg | ORAL_TABLET | Freq: Every evening | ORAL | Status: DC | PRN

## 2019-08-15 MED ORDER — FENTANYL CITRATE (PF) 100 MCG/2ML IJ SOLN: 50.0000 ug | INTRAMUSCULAR | Status: DC | PRN

## 2019-08-15 MED ORDER — OXYCODONE-ACETAMINOPHEN 5-325 MG PO TABS: 2.0000 | ORAL_TABLET | ORAL | Status: DC | PRN

## 2019-08-15 MED ORDER — DIBUCAINE (PERIANAL) 1 % EX OINT: 1.0000 "application " | TOPICAL_OINTMENT | CUTANEOUS | Status: DC | PRN

## 2019-08-15 MED ORDER — DIPHENHYDRAMINE HCL 25 MG PO CAPS: 25.0000 mg | ORAL_CAPSULE | Freq: Four times a day (QID) | ORAL | Status: DC | PRN

## 2019-08-15 MED ORDER — EPHEDRINE 5 MG/ML INJ: 10.0000 mg | INTRAVENOUS | Status: DC | PRN

## 2019-08-15 MED ORDER — FLEET ENEMA 7-19 GM/118ML RE ENEM: 1.0000 | ENEMA | Freq: Once | RECTAL | Status: DC

## 2019-08-15 MED ORDER — METHYLERGONOVINE MALEATE 0.2 MG PO TABS: 0.2000 mg | ORAL_TABLET | ORAL | Status: DC | PRN

## 2019-08-15 MED ORDER — LACTATED RINGERS IV SOLN: 500.0000 mL | Freq: Once | INTRAVENOUS | Status: DC

## 2019-08-15 MED ORDER — FENTANYL-BUPIVACAINE-NACL 0.5-0.125-0.9 MG/250ML-% EP SOLN: 12.0000 mL/h | EPIDURAL | Status: DC | PRN

## 2019-08-15 MED ORDER — OXYTOCIN BOLUS FROM INFUSION
500.0000 mL | Freq: Once | INTRAVENOUS | Status: AC
  Administered 2019-08-15: 09:00:00 500 mL via INTRAVENOUS

## 2019-08-15 MED ORDER — PRENATAL MULTIVITAMIN CH
1.0000 | ORAL_TABLET | Freq: Every day | ORAL | Status: DC
  Administered 2019-08-15 – 2019-08-16 (×2): 1 via ORAL
  Filled 2019-08-15 (×2): qty 1

## 2019-08-15 MED ORDER — OXYTOCIN 40 UNITS IN NORMAL SALINE INFUSION - SIMPLE MED
2.5000 [IU]/h | INTRAVENOUS | Status: DC
  Filled 2019-08-15: qty 1000

## 2019-08-15 MED ORDER — SODIUM CHLORIDE (PF) 0.9 % IJ SOLN
INTRAMUSCULAR | Status: DC | PRN
  Administered 2019-08-15: 12 mL/h via EPIDURAL

## 2019-08-15 MED ORDER — LIDOCAINE HCL (PF) 1 % IJ SOLN
INTRAMUSCULAR | Status: DC | PRN
  Administered 2019-08-15 (×2): 5 mL via EPIDURAL

## 2019-08-15 MED ORDER — SIMETHICONE 80 MG PO CHEW: 80.0000 mg | CHEWABLE_TABLET | ORAL | Status: DC | PRN

## 2019-08-15 MED ORDER — DIPHENHYDRAMINE HCL 50 MG/ML IJ SOLN: 12.5000 mg | INTRAMUSCULAR | Status: DC | PRN

## 2019-08-15 MED ORDER — ACETAMINOPHEN 325 MG PO TABS: 650.0000 mg | ORAL_TABLET | ORAL | Status: DC | PRN

## 2019-08-15 MED ORDER — LACTATED RINGERS IV SOLN
INTRAVENOUS | Status: DC
  Administered 2019-08-15: 08:00:00 via INTRAVENOUS

## 2019-08-15 MED ORDER — SENNOSIDES-DOCUSATE SODIUM 8.6-50 MG PO TABS
2.0000 | ORAL_TABLET | ORAL | Status: DC
  Administered 2019-08-15: 2 via ORAL
  Filled 2019-08-15: qty 2

## 2019-08-15 NOTE — Anesthesia Procedure Notes (Signed)
Epidural Patient location during procedure: OB  Staffing Anesthesiologist: Vaanya Shambaugh, MD Performed: anesthesiologist   Preanesthetic Checklist Completed: patient identified, site marked, surgical consent, pre-op evaluation, timeout performed, IV checked, risks and benefits discussed and monitors and equipment checked  Epidural Patient position: sitting Prep: DuraPrep Patient monitoring: heart rate, continuous pulse ox and blood pressure Approach: right paramedian Location: L3-L4 Injection technique: LOR saline  Needle:  Needle type: Tuohy  Needle gauge: 17 G Needle length: 9 cm and 9 Needle insertion depth: 6 cm Catheter type: closed end flexible Catheter size: 20 Guage Catheter at skin depth: 10 cm Test dose: negative  Assessment Events: blood not aspirated, injection not painful, no injection resistance, negative IV test and no paresthesia  Additional Notes Patient identified. Risks/Benefits/Options discussed with patient including but not limited to bleeding, infection, nerve damage, paralysis, failed block, incomplete pain control, headache, blood pressure changes, nausea, vomiting, reactions to medication both or allergic, itching and postpartum back pain. Confirmed with bedside nurse the patient's most recent platelet count. Confirmed with patient that they are not currently taking any anticoagulation, have any bleeding history or any family history of bleeding disorders. Patient expressed understanding and wished to proceed. All questions were answered. Sterile technique was used throughout the entire procedure. Please see nursing notes for vital signs. Test dose was given through epidural needle and negative prior to continuing to dose epidural or start infusion. Warning signs of high block given to the patient including shortness of breath, tingling/numbness in hands, complete motor block, or any concerning symptoms with instructions to call for help. Patient was given  instructions on fall risk and not to get out of bed. All questions and concerns addressed with instructions to call with any issues.     

## 2019-08-15 NOTE — Anesthesia Preprocedure Evaluation (Signed)

## 2019-08-15 NOTE — Anesthesia Postprocedure Evaluation (Signed)
Anesthesia Post Note  Patient: Joanna Mcpherson  Procedure(s) Performed: AN AD HOC LABOR EPIDURAL     Patient location during evaluation: Mother Baby Anesthesia Type: Epidural Level of consciousness: awake and alert and oriented Pain management: satisfactory to patient Vital Signs Assessment: post-procedure vital signs reviewed and stable Respiratory status: respiratory function stable Cardiovascular status: stable Postop Assessment: no headache, no backache, epidural receding, patient able to bend at knees, no signs of nausea or vomiting and adequate PO intake Anesthetic complications: no    Last Vitals:  Vitals:   08/15/19 1125 08/15/19 1500  BP: 118/72 113/69  Pulse: 73 74  Resp: 16 16  Temp: 36.8 C 37 C  SpO2: 100% 99%    Last Pain:  Vitals:   08/15/19 1500  TempSrc: Oral  PainSc: 2    Pain Goal: Patients Stated Pain Goal: 2 (08/15/19 1500)                 Jomo Forand

## 2019-08-15 NOTE — MAU Note (Signed)
Reports a large gush of fluid at 0440-clear fluid.  CTX every 5 mins.  Some spotting since last exam on Tuesday-2 cm then.  + FM.  No complications w/ pregnancy.  GBS -

## 2019-08-16 LAB — CBC
HCT: 36.8 % (ref 36.0–46.0)
Hemoglobin: 12.5 g/dL (ref 12.0–15.0)
MCH: 31.7 pg (ref 26.0–34.0)
MCHC: 34 g/dL (ref 30.0–36.0)
MCV: 93.4 fL (ref 80.0–100.0)
Platelets: 138 10*3/uL — ABNORMAL LOW (ref 150–400)
RBC: 3.94 MIL/uL (ref 3.87–5.11)
RDW: 13.7 % (ref 11.5–15.5)
WBC: 10.5 10*3/uL (ref 4.0–10.5)
nRBC: 0 % (ref 0.0–0.2)

## 2019-08-16 NOTE — Progress Notes (Signed)
Patient is eating, ambulating, voiding.  Pain control is good.  Vitals:   08/15/19 1500 08/15/19 1854 08/15/19 2308 08/16/19 0500  BP: 113/69 106/71 111/79 114/80  Pulse: 74 87 63 73  Resp: 16 18 17 18   Temp: 98.6 F (37 C) 98.1 F (36.7 C) 97.6 F (36.4 C) 97.7 F (36.5 C)  TempSrc: Oral Oral Oral Oral  SpO2: 99% 98%    Weight:        Fundus firm Perineum without swelling.  Lab Results  Component Value Date   WBC 6.4 08/15/2019   HGB 13.9 08/15/2019   HCT 40.9 08/15/2019   MCV 93.8 08/15/2019   PLT 142 (L) 08/15/2019    --/--/O POS, O POS Performed at [facility], [place] [address], [place], [place] [address]  (08/27 0648)/[place]  A/P Post partum day 1.  Routine care.  Expect d/c routine.    Daria Pastures

## 2019-08-16 NOTE — Lactation Note (Signed)
This note was copied from a baby's chart. Lactation Consultation Note  Patient Name: Girl Correne Lalani Today's Date: 08/16/2019  P3, 10 hour female infant, weight loss -4%. LC entered room, per mom,  she decided not to breastfeed infant. she is only bottle feeding with formula.   Maternal Data    Feeding    LATCH Score                   Interventions    Lactation Tools Discussed/Used     Consult Status      Vicente Serene 08/16/2019, 8:56 PM

## 2019-08-16 NOTE — Discharge Summary (Signed)
Obstetric Discharge Summary Reason for Admission: rupture of membranes Prenatal Procedures: NST Intrapartum Procedures: spontaneous vaginal delivery Postpartum Procedures: none Complications-Operative and Postpartum: none Hemoglobin  Date Value Ref Range Status  08/15/2019 13.9 12.0 - 15.0 g/dL Final   HCT  Date Value Ref Range Status  08/15/2019 40.9 36.0 - 46.0 % Final    Discharge Diagnoses: Term Pregnancy-delivered  Discharge Information: Date: 08/16/2019 Activity: pelvic rest Diet: routine Medications: Ibuprofen Condition: stable Instructions: refer to practice specific booklet Discharge to: home Follow-up Information    Vanessa Kick, MD Follow up in 4 week(s).   Specialty: Obstetrics and Gynecology Contact information: [address] [place] [address] [phone]           Newborn Data: Live born female  Birth Weight: 6 lb 13 oz (3090 g) APGAR: 16, 9  Newborn Delivery   Birth date/time: 08/15/2019 08:41:00 Delivery type: Vaginal, Spontaneous      Home with mother.  Daria Pastures 08/16/2019, 6:29 AM

## 2019-08-17 NOTE — Progress Notes (Signed)
Patient is eating, ambulating, voiding.  Pain control is good.  Vitals:   08/16/19 0500 08/16/19 1500 08/16/19 2227 08/17/19 0413  BP: 114/80 114/74 110/75 109/77  Pulse: 73 72 62 62  Resp: 18 18 16 18   Temp: 97.7 F (36.5 C) 98.5 F (36.9 C) 98.1 F (36.7 C) 98.2 F (36.8 C)  TempSrc: Oral Oral Oral Oral  SpO2:   100% 99%  Weight:        Fundus firm Perineum without swelling.  Lab Results  Component Value Date   WBC 10.5 08/16/2019   HGB 12.5 08/16/2019   HCT 36.8 08/16/2019   MCV 93.4 08/16/2019   PLT 138 (L) 08/16/2019    --/--/O POS, O POS Performed at [facility], [place] [address] [place], [place] [address]  (08/27 0648)/RI  A/P Post partum day 2.  Routine care.  Expect d/c today.    Joanna Mcpherson

## 2019-09-29 NOTE — H&P (Signed)
Joanna Mcpherson is a 33 y.o. female presenting for labor OB History    Gravida  4   Para  3   Term  3   Preterm      AB  1   Living  3     SAB      TAB  1   Ectopic      Multiple  0   Live Births  3          Past Medical History:  Diagnosis Date  . Abnormal Pap smear   . Asthma   . Headache(784.0)   . Hx of migraines    Past Surgical History:  Procedure Laterality Date  . ANKLE FRACTURE SURGERY    . CERVICAL CONE BIOPSY    . elective abortion     Family History: family history includes Diabetes in her mother. Social History:  reports that she has quit smoking. She has never used smokeless tobacco. She reports previous alcohol use. She reports that she does not use drugs.     Maternal Diabetes: No Genetic Screening: Declined Maternal Ultrasounds/Referrals: Normal Fetal Ultrasounds or other Referrals:  None Maternal Substance Abuse:  No Significant Maternal Medications:  None Significant Maternal Lab Results:  None Other Comments:  None  ROS History Dilation: 10 Effacement (%): 90 Station: Plus 2 Exam by:: Christy Goodnight,RNC  Blood pressure 109/77, pulse 62, temperature 98.2 F (36.8 C), temperature source Oral, resp. rate 18, weight 94.9 kg, last menstrual period 11/05/2018, SpO2 99 %, unknown if currently breastfeeding. Exam Physical Exam  Prenatal labs: ABO, Rh: --/--/O POS, O POS Performed at [facility], [place] [address], [place], [place] [address]  [phone]) Antibody: NEG (08/27 3532) Rubella: Immune (03/11 0000) RPR: NON REACTIVE (08/27 9924)  HBsAg: Negative (03/11 0000)  HIV: Non-reactive (03/11 0000)  GBS: Negative/-- (07/29 0000)   Assessment/Plan: 1) Admit 2) Epiduralonrequest 3) Anticipate SVD   Vanessa Kick 09/29/2019, 4:03 PM

## 2019-11-30 ENCOUNTER — Emergency Department (HOSPITAL_BASED_OUTPATIENT_CLINIC_OR_DEPARTMENT_OTHER)
Admission: EM | Admit: 2019-11-30 | Discharge: 2019-11-30 | Disposition: A | Discharge status: Home / Self Care | Payer: BC Managed Care – PPO | Attending: Emergency Medicine | Admitting: Emergency Medicine

## 2019-11-30 ENCOUNTER — Encounter (HOSPITAL_BASED_OUTPATIENT_CLINIC_OR_DEPARTMENT_OTHER): Payer: Self-pay | Admitting: Emergency Medicine

## 2019-11-30 ENCOUNTER — Other Ambulatory Visit: Payer: Self-pay

## 2019-11-30 DIAGNOSIS — J45909 Unspecified asthma, uncomplicated: Secondary | ICD-10-CM | POA: Diagnosis not present

## 2019-11-30 DIAGNOSIS — B3731 Acute candidiasis of vulva and vagina: Secondary | ICD-10-CM

## 2019-11-30 DIAGNOSIS — Z79899 Other long term (current) drug therapy: Secondary | ICD-10-CM | POA: Insufficient documentation

## 2019-11-30 DIAGNOSIS — Z87891 Personal history of nicotine dependence: Secondary | ICD-10-CM | POA: Insufficient documentation

## 2019-11-30 DIAGNOSIS — N898 Other specified noninflammatory disorders of vagina: Secondary | ICD-10-CM | POA: Diagnosis present

## 2019-11-30 DIAGNOSIS — B373 Candidiasis of vulva and vagina: Secondary | ICD-10-CM | POA: Insufficient documentation

## 2019-11-30 LAB — URINALYSIS, ROUTINE W REFLEX MICROSCOPIC
Bilirubin Urine: NEGATIVE
Glucose, UA: NEGATIVE mg/dL
Hgb urine dipstick: NEGATIVE
Ketones, ur: NEGATIVE mg/dL
Leukocytes,Ua: NEGATIVE
Nitrite: NEGATIVE
Protein, ur: NEGATIVE mg/dL
Specific Gravity, Urine: <1.005 — ABNORMAL LOW (ref 1.005–1.030)
pH: 6 (ref 5.0–8.0)

## 2019-11-30 LAB — WET PREP, GENITAL
Clue Cells Wet Prep HPF POC: NONE SEEN
Sperm: NONE SEEN
Trich, Wet Prep: NONE SEEN

## 2019-11-30 LAB — PREGNANCY, URINE: Preg Test, Ur: NEGATIVE

## 2019-11-30 MED ORDER — FLUCONAZOLE 150 MG PO TABS
150.0000 mg | ORAL_TABLET | Freq: Once | ORAL | Status: AC
  Administered 2019-11-30: 06:00:00 150 mg via ORAL
  Filled 2019-11-30: qty 1

## 2019-11-30 NOTE — ED Triage Notes (Signed)
Patient arrived via POV c/o vaginal discharge. Patient states this feels cramps and feels like return of UTI post partum x 3 months. Patient is AO x 4, VS WDL, normal gait.

## 2019-11-30 NOTE — ED Provider Notes (Addendum)
[facility] EMERGENCY DEPARTMENT Provider Note   CSN: [id number] Arrival date & time: 11/30/19  7564     History Chief Complaint  Patient presents with  . Vaginal Discharge    Joanna Mcpherson is a 33 y.o. female.  The history is provided by the patient.  Vaginal Discharge Quality:  White Severity:  Mild Onset quality:  Gradual Duration:  3 months Timing:  Constant Progression:  Unchanged Chronicity:  New Context: spontaneously   Context: not after intercourse   Relieved by:  Nothing Worsened by:  Nothing Ineffective treatments:  None tried Associated symptoms: vaginal itching   Associated symptoms: no abdominal pain, no dysuria, no fever and no urinary frequency   Risk factors: no PID   Patient who delivered in September presents with discharge and itching.  No f/c/r.  Is now having some cramping.       Past Medical History:  Diagnosis Date  . Abnormal Pap smear   . Asthma   . Headache(784.0)   . Hx of migraines     Patient Active Problem List   Diagnosis Date Noted  . Pregnancy 08/15/2019  . Spontaneous vaginal delivery 08/15/2019  . Active labor 04/21/2013  . Normal delivery 04/21/2013  . GERD without esophagitis 04/09/2013  . Irregular contractions 03/14/2013    Past Surgical History:  Procedure Laterality Date  . ANKLE FRACTURE SURGERY    . CERVICAL CONE BIOPSY    . elective abortion       OB History    Gravida  4   Para  3   Term  3   Preterm      AB  1   Living  3     SAB      TAB  1   Ectopic      Multiple  0   Live Births  3           Family History  Problem Relation Age of Onset  . Diabetes Mother     Social History   Tobacco Use  . Smoking status: Former Games developer  . Smokeless tobacco: Never Used  Substance Use Topics  . Alcohol use: Not Currently    Alcohol/week: 0.0 standard drinks  . Drug use: No    Home Medications Prior to Admission medications   Medication Sig Start Date End Date  Taking? Authorizing Provider  acetaminophen (TYLENOL) 500 MG tablet Take 500 mg by mouth every 6 (six) hours as needed.    [provider]  famotidine (PEPCID) 20 MG tablet Take 20 mg by mouth 2 (two) times daily.    [provider]  ondansetron (ZOFRAN ODT) 4 MG disintegrating tablet Take 1 tablet (4 mg total) by mouth every 6 (six) hours as needed for nausea. 07/11/19   Calvert Cantor, CNM  Prenatal Vit-Fe Fumarate-FA (MULTIVITAMIN-PRENATAL) 27-0.8 MG TABS tablet Take 1 tablet by mouth daily at 12 noon.    [provider]    Allergies    Patient has no known allergies.  Review of Systems   Review of Systems  Constitutional: Negative for fever.  HENT: Negative for congestion.   Eyes: Negative for visual disturbance.  Respiratory: Negative for shortness of breath.   Gastrointestinal: Negative for abdominal pain.  Genitourinary: Positive for vaginal discharge. Negative for dysuria and vaginal bleeding.  Musculoskeletal: Negative for arthralgias.  Neurological: Negative for dizziness.  Psychiatric/Behavioral: Negative for agitation.  All other systems reviewed and are negative.   Physical Exam Updated Vital Signs  There were no vitals taken for this visit.  Physical Exam Vitals and nursing note reviewed.  Constitutional:      Appearance: Normal appearance.  HENT:     Head: Normocephalic and atraumatic.     Nose: Nose normal.  Eyes:     Conjunctiva/sclera: Conjunctivae normal.     Pupils: Pupils are equal, round, and reactive to light.  Cardiovascular:     Rate and Rhythm: Normal rate and regular rhythm.     Pulses: Normal pulses.     Heart sounds: Normal heart sounds.  Pulmonary:     Effort: Pulmonary effort is normal.     Breath sounds: Normal breath sounds.  Abdominal:     General: Abdomen is flat. Bowel sounds are normal.     Tenderness: There is no abdominal tenderness. There is no guarding or rebound.  Genitourinary:    Comments:  Scant white discharge Musculoskeletal:        General: Normal range of motion.     Cervical back: Normal range of motion and neck supple.  Skin:    General: Skin is warm and dry.     Capillary Refill: Capillary refill takes less than 2 seconds.  Neurological:     General: No focal deficit present.     Mental Status: She is alert and oriented to person, place, and time.  Psychiatric:        Mood and Affect: Mood normal.        Behavior: Behavior normal.     ED Results / Procedures / Treatments   Labs (all labs ordered are listed, but only abnormal results are displayed) Results for orders placed or performed during the hospital encounter of 11/30/19  Wet prep, genital   Specimen: PATH Cytology Cervicovaginal Ancillary Only  Result Value Ref Range   Yeast Wet Prep HPF POC PRESENT (A) NONE SEEN   Trich, Wet Prep NONE SEEN NONE SEEN   Clue Cells Wet Prep HPF POC NONE SEEN NONE SEEN   WBC, Wet Prep HPF POC MANY (A) NONE SEEN   Sperm NONE SEEN   Pregnancy, urine  Result Value Ref Range   Preg Test, Ur NEGATIVE NEGATIVE  Urinalysis, Routine w reflex microscopic  Result Value Ref Range   Color, Urine COLORLESS (A) YELLOW   APPearance CLEAR CLEAR   Specific Gravity, Urine <1.005 (L) 1.005 - 1.030   pH 6.0 5.0 - 8.0   Glucose, UA NEGATIVE NEGATIVE mg/dL   Hgb urine dipstick NEGATIVE NEGATIVE   Bilirubin Urine NEGATIVE NEGATIVE   Ketones, ur NEGATIVE NEGATIVE mg/dL   Protein, ur NEGATIVE NEGATIVE mg/dL   Nitrite NEGATIVE NEGATIVE   Leukocytes,Ua NEGATIVE NEGATIVE   No results found.   EKG None  Radiology No results found.  Procedures Procedures (including critical care time)  Medications Ordered in ED Medications  fluconazole (DIFLUCAN) tablet 150 mg (has no administration in time range)    ED Course  I have reviewed the triage vital signs and the nursing notes.  Pertinent labs & imaging results that were available during my care of the patient were reviewed by  me and considered in my medical decision making (see chart for details).   Exam and wet prep consistent with yeast.  Treated in the ED with diflucan.  May use vaginal yeast cream for itching.   Follow up with your GYN    KARISS LONGMIRE was evaluated in Emergency Department on 11/30/2019 for the symptoms described in the history of present illness. She was  evaluated in the context of the global COVID-19 pandemic, which necessitated consideration that the patient might be at risk for infection with the SARS-CoV-2 virus that causes COVID-19. Institutional protocols and algorithms that pertain to the evaluation of patients at risk for COVID-19 are in a state of rapid change based on information released by regulatory bodies including the CDC and federal and state organizations. These policies and algorithms were followed during the patient's care in the ED.  Final Clinical Impression(s) / ED Diagnoses  Return for intractable cough, coughing up blood,fevers >100.4 unrelieved by medication, shortness of breath, intractable vomiting, chest pain, shortness of breath, weakness,numbness, changes in speech, facial asymmetry,abdominal pain, passing out,Inability to tolerate liquids or food, cough, altered mental status or any concerns. No signs of systemic illness or infection. The patient is nontoxic-appearing on exam and vital signs are within normal limits.   I have reviewed the triage vital signs and the nursing notes. Pertinent labs &imaging results that were available during my care of the patient were reviewed by me and considered in my medical decision making (see chart for details).  After history, exam, and medical workup I feel the patient has been appropriately medically screened and is safe for discharge home. Pertinent diagnoses were discussed with the patient. Patient was given return precautions   Koran Seabrook, MD 11/30/19 0600    Ragan Duhon, MD 11/30/19 0600

## 2019-12-03 LAB — GC/CHLAMYDIA PROBE AMP (~~LOC~~) NOT AT ARMC
Chlamydia: NEGATIVE
Neisseria Gonorrhea: NEGATIVE

## 2020-03-29 ENCOUNTER — Emergency Department (HOSPITAL_BASED_OUTPATIENT_CLINIC_OR_DEPARTMENT_OTHER)
Admission: EM | Admit: 2020-03-29 | Discharge: 2020-03-30 | Disposition: A | Discharge status: Home / Self Care | Payer: Medicaid Other | Attending: Emergency Medicine | Admitting: Emergency Medicine

## 2020-03-29 ENCOUNTER — Other Ambulatory Visit: Payer: Self-pay

## 2020-03-29 ENCOUNTER — Emergency Department (HOSPITAL_BASED_OUTPATIENT_CLINIC_OR_DEPARTMENT_OTHER): Payer: Medicaid Other

## 2020-03-29 ENCOUNTER — Encounter (HOSPITAL_BASED_OUTPATIENT_CLINIC_OR_DEPARTMENT_OTHER): Payer: Self-pay | Admitting: *Deleted

## 2020-03-29 DIAGNOSIS — S92512A Displaced fracture of proximal phalanx of left lesser toe(s), initial encounter for closed fracture: Secondary | ICD-10-CM | POA: Diagnosis not present

## 2020-03-29 DIAGNOSIS — J45909 Unspecified asthma, uncomplicated: Secondary | ICD-10-CM | POA: Diagnosis not present

## 2020-03-29 DIAGNOSIS — W010XXA Fall on same level from slipping, tripping and stumbling without subsequent striking against object, initial encounter: Secondary | ICD-10-CM | POA: Diagnosis not present

## 2020-03-29 DIAGNOSIS — Z79899 Other long term (current) drug therapy: Secondary | ICD-10-CM | POA: Insufficient documentation

## 2020-03-29 DIAGNOSIS — S99922A Unspecified injury of left foot, initial encounter: Secondary | ICD-10-CM | POA: Diagnosis present

## 2020-03-29 DIAGNOSIS — Y999 Unspecified external cause status: Secondary | ICD-10-CM | POA: Insufficient documentation

## 2020-03-29 DIAGNOSIS — Y929 Unspecified place or not applicable: Secondary | ICD-10-CM | POA: Insufficient documentation

## 2020-03-29 DIAGNOSIS — Z87891 Personal history of nicotine dependence: Secondary | ICD-10-CM | POA: Diagnosis not present

## 2020-03-29 DIAGNOSIS — Y9301 Activity, walking, marching and hiking: Secondary | ICD-10-CM | POA: Insufficient documentation

## 2020-03-29 NOTE — ED Provider Notes (Signed)
[facility] EMERGENCY DEPARTMENT Provider Note   CSN: [id number] Arrival date & time: 03/29/20  2213     History Chief Complaint  Patient presents with  . Fall    Joanna Mcpherson is a 34 y.o. female.  HPI     This is a 34 year old female with a history of asthma who presents following a fall.  Patient reports that she tripped and fell yesterday.  She injured her left third toe.  She reports increasing pain with walking.  She has noted some bruising to that toe.  She also bruised her arm but denies any significant pain.  She rates her toe pain at 8 out of 10.  Denies any numbness or tingling.  Of note, she did take a home pregnancy test today and this was noted to be positive.  Last menstrual period was March 11.  She is not having any pregnancy related complaints.  Past Medical History:  Diagnosis Date  . Abnormal Pap smear   . Asthma   . Headache(784.0)   . Hx of migraines     Patient Active Problem List   Diagnosis Date Noted  . Pregnancy 08/15/2019  . Spontaneous vaginal delivery 08/15/2019  . Active labor 04/21/2013  . Normal delivery 04/21/2013  . GERD without esophagitis 04/09/2013  . Irregular contractions 03/14/2013    Past Surgical History:  Procedure Laterality Date  . ANKLE FRACTURE SURGERY    . CERVICAL CONE BIOPSY    . elective abortion       OB History    Gravida  5   Para  3   Term  3   Preterm      AB  1   Living  3     SAB      TAB  1   Ectopic      Multiple  0   Live Births  3           Family History  Problem Relation Age of Onset  . Diabetes Mother     Social History   Tobacco Use  . Smoking status: Former Research scientist (life sciences)  . Smokeless tobacco: Never Used  Substance Use Topics  . Alcohol use: Not Currently    Alcohol/week: 0.0 standard drinks  . Drug use: No    Home Medications Prior to Admission medications   Medication Sig Start Date End Date Taking? Authorizing Provider  acetaminophen (TYLENOL) 500  MG tablet Take 500 mg by mouth every 6 (six) hours as needed.    [provider]  famotidine (PEPCID) 20 MG tablet Take 20 mg by mouth 2 (two) times daily.    [provider]  ondansetron (ZOFRAN ODT) 4 MG disintegrating tablet Take 1 tablet (4 mg total) by mouth every 6 (six) hours as needed for nausea. 07/11/19   Darlina Rumpf, CNM  Prenatal Vit-Fe Fumarate-FA (MULTIVITAMIN-PRENATAL) 27-0.8 MG TABS tablet Take 1 tablet by mouth daily at 12 noon.    [provider]    Allergies    Patient has no known allergies.  Review of Systems   Review of Systems  Constitutional: Negative for fever.  Genitourinary: Negative for vaginal bleeding and vaginal pain.  Musculoskeletal:       Left great toe pain  All other systems reviewed and are negative.   Physical Exam Updated Vital Signs BP 110/65 (BP Location: Left Arm)   Pulse (!) 101   Temp 98.7 F (37.1 C) (Oral)   Resp 16   Ht  1.676 m (5\' 6" )   Wt 90.7 kg   LMP 02/27/2020   SpO2 99%   Breastfeeding No   BMI 32.28 kg/m   Physical Exam Vitals and nursing note reviewed.  Constitutional:      Appearance: She is well-developed. She is obese. She is not ill-appearing.  HENT:     Head: Normocephalic and atraumatic.  Cardiovascular:     Rate and Rhythm: Normal rate and regular rhythm.  Pulmonary:     Effort: Pulmonary effort is normal. No respiratory distress.  Musculoskeletal:     Cervical back: Neck supple.     Right lower leg: No edema.     Left lower leg: No edema.     Comments: Bruising and decreased range of motion noted of the left third digit with slight swelling, no obvious deformity  Skin:    General: Skin is warm and dry.     Comments: Bruises noted bilateral arms  Neurological:     Mental Status: She is alert and oriented to person, place, and time.  Psychiatric:        Mood and Affect: Mood normal.     ED Results / Procedures / Treatments   Labs (all labs ordered are listed, but  only abnormal results are displayed) Labs Reviewed - No data to display  EKG None  Radiology DG Toe 3rd Left  Result Date: 03/30/2020 CLINICAL DATA:  Toe injury EXAM: LEFT THIRD TOE COMPARISON:  None. FINDINGS: Acute fracture involving the midshaft of the third proximal phalanx with 1/4 shaft diameter of lateral and plantar displacement of distal fracture fragment. No subluxation IMPRESSION: Acute mildly displaced fracture involving midshaft of the third proximal phalanx Electronically Signed   By: 05/30/2020 M.D.   On: 03/30/2020 00:05    Procedures Procedures (including critical care time)  Medications Ordered in ED Medications - No data to display  ED Course  I have reviewed the triage vital signs and the nursing notes.  Pertinent labs & imaging results that were available during my care of the patient were reviewed by me and considered in my medical decision making (see chart for details).    MDM Rules/Calculators/A&P                       Patient presents with left middle toe pain.  She fell yesterday.  She is overall nontoxic and vital signs are reassuring.  She does have some bruising and swelling to that toe but is neurovascularly intact.  X-rays obtained and shows a mildly displaced fracture of the proximal phalanx.  X-rays independently reviewed by myself.  Do not feel reduction is necessary at this time.  Will buddy tape to adjacent to and put in a postop shoe.  Will provide sports medicine follow-up.  Given her pregnancy, recommend Tylenol for pain.  After history, exam, and medical workup I feel the patient has been appropriately medically screened and is safe for discharge home. Pertinent diagnoses were discussed with the patient. Patient was given return precautions.   Final Clinical Impression(s) / ED Diagnoses Final diagnoses:  Closed displaced fracture of proximal phalanx of lesser toe of left foot, initial encounter    Rx / DC Orders ED Discharge Orders      None       Keysi Oelkers, 05/30/2020, MD 03/30/20 0020

## 2020-03-29 NOTE — ED Triage Notes (Addendum)
Pt reports she slipped and fell on carpet last night and injured her middle left toe. Also states she found out tonight she is pregnant

## 2020-03-30 NOTE — Discharge Instructions (Addendum)
You were seen today and have a fracture of your left middle toe.  Keep buddy taped to the adjacent toe.  Wear a postop shoe for comfort and to stabilize the foot.  Follow-up with sports medicine.  Given that you are pregnant, you need to take Tylenol for pain.

## 2020-04-18 DIAGNOSIS — O039 Complete or unspecified spontaneous abortion without complication: Secondary | ICD-10-CM

## 2020-04-18 HISTORY — DX: Complete or unspecified spontaneous abortion without complication: O03.9

## 2020-05-13 ENCOUNTER — Other Ambulatory Visit: Payer: Self-pay

## 2020-05-13 ENCOUNTER — Emergency Department (HOSPITAL_BASED_OUTPATIENT_CLINIC_OR_DEPARTMENT_OTHER)
Admission: EM | Admit: 2020-05-13 | Discharge: 2020-05-13 | Disposition: A | Discharge status: Home / Self Care | Payer: Medicaid Other | Attending: Emergency Medicine | Admitting: Emergency Medicine

## 2020-05-13 ENCOUNTER — Encounter (HOSPITAL_BASED_OUTPATIENT_CLINIC_OR_DEPARTMENT_OTHER): Payer: Self-pay | Admitting: Emergency Medicine

## 2020-05-13 ENCOUNTER — Emergency Department (HOSPITAL_BASED_OUTPATIENT_CLINIC_OR_DEPARTMENT_OTHER): Payer: Medicaid Other

## 2020-05-13 DIAGNOSIS — O039 Complete or unspecified spontaneous abortion without complication: Secondary | ICD-10-CM

## 2020-05-13 DIAGNOSIS — Z79899 Other long term (current) drug therapy: Secondary | ICD-10-CM | POA: Diagnosis not present

## 2020-05-13 DIAGNOSIS — O200 Threatened abortion: Secondary | ICD-10-CM | POA: Insufficient documentation

## 2020-05-13 DIAGNOSIS — Z87891 Personal history of nicotine dependence: Secondary | ICD-10-CM | POA: Diagnosis not present

## 2020-05-13 DIAGNOSIS — O209 Hemorrhage in early pregnancy, unspecified: Secondary | ICD-10-CM | POA: Diagnosis present

## 2020-05-13 LAB — CBC
HCT: 40 % (ref 36.0–46.0)
HCT: 36.7 % (ref 36.0–46.0)
Hemoglobin: 14.1 g/dL (ref 12.0–15.0)
Hemoglobin: 12.5 g/dL (ref 12.0–15.0)
MCH: 31.9 pg (ref 26.0–34.0)
MCH: 31.6 pg (ref 26.0–34.0)
MCHC: 35.3 g/dL (ref 30.0–36.0)
MCHC: 34.1 g/dL (ref 30.0–36.0)
MCV: 90.5 fL (ref 80.0–100.0)
MCV: 92.7 fL (ref 80.0–100.0)
Platelets: 190 10*3/uL (ref 150–400)
Platelets: 154 10*3/uL (ref 150–400)
RBC: 4.42 MIL/uL (ref 3.87–5.11)
RBC: 3.96 MIL/uL (ref 3.87–5.11)
RDW: 12.6 % (ref 11.5–15.5)
RDW: 12.6 % (ref 11.5–15.5)
WBC: 13.7 10*3/uL — ABNORMAL HIGH (ref 4.0–10.5)
WBC: 12.6 10*3/uL — ABNORMAL HIGH (ref 4.0–10.5)
nRBC: 0 % (ref 0.0–0.2)
nRBC: 0 % (ref 0.0–0.2)

## 2020-05-13 LAB — BASIC METABOLIC PANEL
Anion gap: 10 (ref 5–15)
BUN: 7 mg/dL (ref 6–20)
CO2: 21 mmol/L — ABNORMAL LOW (ref 22–32)
Calcium: 8.7 mg/dL — ABNORMAL LOW (ref 8.9–10.3)
Chloride: 105 mmol/L (ref 98–111)
Creatinine, Ser: 0.5 mg/dL (ref 0.44–1.00)
GFR calc Af Amer: >60 mL/min (ref >60)
GFR calc non Af Amer: >60 mL/min (ref >60)
Glucose, Bld: 102 mg/dL — ABNORMAL HIGH (ref 70–99)
Potassium: 3.4 mmol/L — ABNORMAL LOW (ref 3.5–5.1)
Sodium: 136 mmol/L (ref 135–145)

## 2020-05-13 LAB — HCG, QUANTITATIVE, PREGNANCY: hCG, Beta Chain, Quant, S: 61559 m[IU]/mL — ABNORMAL HIGH (ref <5)

## 2020-05-13 MED ORDER — MORPHINE SULFATE (PF) 4 MG/ML IV SOLN
4.0000 mg | Freq: Once | INTRAVENOUS | Status: AC
  Administered 2020-05-13: 4 mg via INTRAVENOUS
  Filled 2020-05-13: qty 1

## 2020-05-13 MED ORDER — SODIUM CHLORIDE 0.9 % IV BOLUS
1000.0000 mL | Freq: Once | INTRAVENOUS | Status: AC
  Administered 2020-05-13: 1000 mL via INTRAVENOUS

## 2020-05-13 MED ORDER — ONDANSETRON 4 MG PO TBDP: 4.0000 mg | ORAL_TABLET | Freq: Three times a day (TID) | ORAL | 0 refills | Status: DC | PRN

## 2020-05-13 MED ORDER — METOCLOPRAMIDE HCL 5 MG/ML IJ SOLN
10.0000 mg | Freq: Once | INTRAMUSCULAR | Status: AC
  Administered 2020-05-13: 10 mg via INTRAVENOUS
  Filled 2020-05-13: qty 2

## 2020-05-13 MED ORDER — HYDROMORPHONE HCL 1 MG/ML IJ SOLN
1.0000 mg | Freq: Once | INTRAMUSCULAR | Status: AC
  Administered 2020-05-13: 1 mg via INTRAVENOUS
  Filled 2020-05-13: qty 1

## 2020-05-13 MED ORDER — HYDROCODONE-ACETAMINOPHEN 5-325 MG PO TABS: 1.0000 | ORAL_TABLET | ORAL | 0 refills | Status: DC | PRN

## 2020-05-13 MED ORDER — KETOROLAC TROMETHAMINE 15 MG/ML IJ SOLN
15.0000 mg | Freq: Once | INTRAMUSCULAR | Status: AC
  Administered 2020-05-13: 15 mg via INTRAVENOUS
  Filled 2020-05-13: qty 1

## 2020-05-13 MED ORDER — NAPROXEN 375 MG PO TABS
375.0000 mg | ORAL_TABLET | Freq: Two times a day (BID) | ORAL | 0 refills | Status: DC
Start: 2020-05-13 — End: 2020-06-15

## 2020-05-13 MED ORDER — ONDANSETRON HCL 4 MG/2ML IJ SOLN
4.0000 mg | Freq: Once | INTRAMUSCULAR | Status: AC
  Administered 2020-05-13: 4 mg via INTRAVENOUS
  Filled 2020-05-13: qty 2

## 2020-05-13 MED ORDER — HYDROCODONE-ACETAMINOPHEN 5-325 MG PO TABS
1.0000 | ORAL_TABLET | Freq: Once | ORAL | Status: AC | PRN
  Administered 2020-05-13: 1 via ORAL
  Filled 2020-05-13: qty 1

## 2020-05-13 NOTE — Discharge Instructions (Addendum)
If you develop worsening, continued, or recurrent abdominal pain, uncontrolled vomiting, fever, chest or back pain, uncontrolled bleeding, or any other new/concerning symptoms then return to the ER for evaluation.   You are being prescribed naproxen. Do not take Ibuprofen/Advil/Aleve/Motrin/Goody Powders/Naproxen/BC powders/Meloxicam/Diclofenac/Indomethacin and other Nonsteroidal anti-inflammatory medications

## 2020-05-13 NOTE — ED Provider Notes (Signed)
Care transferred to me.  Patient's ultrasound shows no IUP.  This indicates pregnancy loss which I have discussed with her.  She had an ultrasound at the beginning of the month that did show IUP.  Will give Norco and naproxen for pain.  She feels like she passed most of the blood and this is slowing down.  She is still hurting but feels well enough for discharge.  We discussed return precautions.  Results for orders placed or performed during the hospital encounter of 05/13/20  CBC  Result Value Ref Range   WBC 13.7 (H) 4.0 - 10.5 K/uL   RBC 4.42 3.87 - 5.11 MIL/uL   Hemoglobin 14.1 12.0 - 15.0 g/dL   HCT 61.6 07.3 - 71.0 %   MCV 90.5 80.0 - 100.0 fL   MCH 31.9 26.0 - 34.0 pg   MCHC 35.3 30.0 - 36.0 g/dL   RDW 62.6 94.8 - 54.6 %   Platelets 190 150 - 400 K/uL   nRBC 0.0 0.0 - 0.2 %  Basic metabolic panel  Result Value Ref Range   Sodium 136 135 - 145 mmol/L   Potassium 3.4 (L) 3.5 - 5.1 mmol/L   Chloride 105 98 - 111 mmol/L   CO2 21 (L) 22 - 32 mmol/L   Glucose, Bld 102 (H) 70 - 99 mg/dL   BUN 7 6 - 20 mg/dL   Creatinine, Ser 2.70 0.44 - 1.00 mg/dL   Calcium 8.7 (L) 8.9 - 10.3 mg/dL   GFR calc non Af Amer >60 >60 mL/min   GFR calc Af Amer >60 >60 mL/min   Anion gap 10 5 - 15  hCG, quantitative, pregnancy  Result Value Ref Range   hCG, Beta Chain, Quant, S 61,559 (H) <5 mIU/mL  CBC  Result Value Ref Range   WBC 12.6 (H) 4.0 - 10.5 K/uL   RBC 3.96 3.87 - 5.11 MIL/uL   Hemoglobin 12.5 12.0 - 15.0 g/dL   HCT 35.0 09.3 - 81.8 %   MCV 92.7 80.0 - 100.0 fL   MCH 31.6 26.0 - 34.0 pg   MCHC 34.1 30.0 - 36.0 g/dL   RDW 29.9 37.1 - 69.6 %   Platelets 154 150 - 400 K/uL   nRBC 0.0 0.0 - 0.2 %   US OB Comp Less 14 Wks  Result Date: 05/13/2020 CLINICAL DATA:  Pelvic pain and heavy vaginal bleeding in 1st trimester pregnancy. Gestational age by LMP of level weeks 0 days. EXAM: OBSTETRIC <14 WK ULTRASOUND TECHNIQUE: Transabdominal ultrasound was performed for evaluation of the gestation  as well as the maternal uterus and adnexal regions. Transvaginal sonography could not be performed due to heavy vaginal bleeding at time of this exam. COMPARISON:  None. FINDINGS: Intrauterine gestational sac: None Maternal uterus/adnexae: Endometrium shows heterogeneous echotexture and measures 23 mm in thickness. No fibroids identified. Both ovaries are normal in appearance. No adnexal mass or abnormal free fluid identified. IMPRESSION: Pregnancy of unknown anatomic location (no intrauterine gestational sac or adnexal mass identified). Differential diagnosis includes recent spontaneous abortion, IUP too early to visualize, and non-visualized ectopic pregnancy. Recommend correlation with serial beta-hCG levels, and follow up US if warranted clinically. Electronically Signed   By: Danae Orleans M.D.   On: 05/13/2020 09:13      Pricilla Loveless, MD 05/13/20 [phone]

## 2020-05-13 NOTE — ED Provider Notes (Signed)
[facility] EMERGENCY DEPARTMENT Provider Note   CSN: [id number] Arrival date & time: 05/13/20  5102     History Chief Complaint  Patient presents with  . Vaginal Bleeding    Joanna Mcpherson is a 34 y.o. female.  Patient presents to the emergency department with concerns of possible miscarriage.  Patient reports that she is [redacted] weeks pregnant.  She has had prenatal care with Dr. Tenny Craw at [facility].  She reports that she woke up at 3:30 AM noticed that she was starting to have cramping and then started having vaginal bleeding.  She came directly to the emergency department.        Past Medical History:  Diagnosis Date  . Abnormal Pap smear   . Asthma   . Headache(784.0)   . Hx of migraines     Patient Active Problem List   Diagnosis Date Noted  . Pregnancy 08/15/2019  . Spontaneous vaginal delivery 08/15/2019  . Active labor 04/21/2013  . Normal delivery 04/21/2013  . GERD without esophagitis 04/09/2013  . Irregular contractions 03/14/2013    Past Surgical History:  Procedure Laterality Date  . ANKLE FRACTURE SURGERY    . CERVICAL CONE BIOPSY    . elective abortion       OB History    Gravida  5   Para  3   Term  3   Preterm      AB  1   Living  3     SAB      TAB  1   Ectopic      Multiple  0   Live Births  3           Family History  Problem Relation Age of Onset  . Diabetes Mother     Social History   Tobacco Use  . Smoking status: Former Games developer  . Smokeless tobacco: Never Used  Substance Use Topics  . Alcohol use: Not Currently    Alcohol/week: 0.0 standard drinks  . Drug use: No    Home Medications Prior to Admission medications   Medication Sig Start Date End Date Taking? Authorizing Provider  acetaminophen (TYLENOL) 500 MG tablet Take 500 mg by mouth every 6 (six) hours as needed.    [provider]  famotidine (PEPCID) 20 MG tablet Take 20 mg by mouth 2 (two) times daily.    [provider]  ondansetron (ZOFRAN ODT) 4 MG disintegrating tablet Take 1 tablet (4 mg total) by mouth every 6 (six) hours as needed for nausea. 07/11/19   Calvert Cantor, CNM  Prenatal Vit-Fe Fumarate-FA (MULTIVITAMIN-PRENATAL) 27-0.8 MG TABS tablet Take 1 tablet by mouth daily at 12 noon.    [provider]    Allergies    Patient has no known allergies.  Review of Systems   Review of Systems  Genitourinary: Positive for pelvic pain and vaginal bleeding.  All other systems reviewed and are negative.   Physical Exam Updated Vital Signs BP 111/60   Pulse 93   Resp 18   Ht 5\' 6"  (1.676 m)   Wt 91.6 kg   LMP 02/27/2020   SpO2 100%   BMI 32.60 kg/m   Physical Exam Vitals and nursing note reviewed.  Constitutional:      General: She is not in acute distress.    Appearance: Normal appearance. She is well-developed.  HENT:     Head: Normocephalic and atraumatic.     Right Ear: Hearing normal.  Left Ear: Hearing normal.     Nose: Nose normal.  Eyes:     Conjunctiva/sclera: Conjunctivae normal.     Pupils: Pupils are equal, round, and reactive to light.  Cardiovascular:     Rate and Rhythm: Regular rhythm.     Heart sounds: S1 normal and S2 normal. No murmur. No friction rub. No gallop.   Pulmonary:     Effort: Pulmonary effort is normal. No respiratory distress.     Breath sounds: Normal breath sounds.  Chest:     Chest wall: No tenderness.  Abdominal:     General: Bowel sounds are normal.     Palpations: Abdomen is soft.     Tenderness: There is no abdominal tenderness. There is no guarding or rebound. Negative signs include Murphy's sign and McBurney's sign.     Hernia: No hernia is present.  Musculoskeletal:        General: Normal range of motion.     Cervical back: Normal range of motion and neck supple.  Skin:    General: Skin is warm and dry.     Findings: No rash.  Neurological:     Mental Status: She is alert and oriented to person, place,  and time.     GCS: GCS eye subscore is 4. GCS verbal subscore is 5. GCS motor subscore is 6.     Cranial Nerves: No cranial nerve deficit.     Sensory: No sensory deficit.     Coordination: Coordination normal.  Psychiatric:        Mood and Affect: Affect is tearful.        Speech: Speech normal.        Behavior: Behavior normal.        Thought Content: Thought content normal.     ED Results / Procedures / Treatments   Labs (all labs ordered are listed, but only abnormal results are displayed) Labs Reviewed  CBC - Abnormal; Notable for the following components:      Result Value   WBC 13.7 (*)    All other components within normal limits  BASIC METABOLIC PANEL - Abnormal; Notable for the following components:   Potassium 3.4 (*)    CO2 21 (*)    Glucose, Bld 102 (*)    Calcium 8.7 (*)    All other components within normal limits  HCG, QUANTITATIVE, PREGNANCY - Abnormal; Notable for the following components:   hCG, Beta Chain, Quant, S 61,559 (*)    All other components within normal limits  CBC    EKG None  Radiology No results found.  Procedures Procedures (including critical care time)  Medications Ordered in ED Medications  sodium chloride 0.9 % bolus 1,000 mL ( Intravenous Stopped 05/13/20 0522)  morphine 4 MG/ML injection 4 mg (4 mg Intravenous Given 05/13/20 0425)  ondansetron (ZOFRAN) injection 4 mg (4 mg Intravenous Given 05/13/20 0424)  morphine 4 MG/ML injection 4 mg (4 mg Intravenous Given 05/13/20 0524)  sodium chloride 0.9 % bolus 1,000 mL (1,000 mLs Intravenous New Bag/Given 05/13/20 0525)  HYDROmorphone (DILAUDID) injection 1 mg (1 mg Intravenous Given 05/13/20 4163)    ED Course  I have reviewed the triage vital signs and the nursing notes.  Pertinent labs & imaging results that were available during my care of the patient were reviewed by me and considered in my medical decision making (see chart for details).    MDM Rules/Calculators/A&P  Patient presents with vaginal bleeding and pelvic pain/cramping.  Patient reports that she is approximately [redacted] weeks pregnant.  I have reviewed records from her visit on May 5 at Dr. Alan Ripper office.  She did have imaging at that time that did confirm intrauterine pregnancy.  She did, however, have uterine size for dates discrepancy noted.  Record review also indicates blood type O+.  Speculum examination reveals moderate amount of active bleeding, some small amount of clot in fornix.  5 AM -discussed with Dr. Wendi Snipes, on-call for [facility].  Recommends further monitoring here as the patient is not currently unstable.  Try for better pain control and monitor amount of bleeding.  Indication for transfer to [facility] would be need for D&C.  At this time she does not need emergent D&C.  Recheck at 630.  Pain improved after additional pain medication but reports continued bleeding.  Will recheck CBC now as she has been hydrated.  Will sign out to oncoming ER physician.   Final Clinical Impression(s) / ED Diagnoses Final diagnoses:  Incomplete miscarriage    Rx / DC Orders ED Discharge Orders    None       Orpah Greek, MD 05/13/20 [phone]

## 2020-05-13 NOTE — ED Notes (Signed)
Pt changed pad, saturated red blood no visible clots

## 2020-05-13 NOTE — ED Triage Notes (Signed)
Pt reports abd cramping today with vaginal bleeding starting tonight. Pt is [redacted] weeks pregnant.

## 2020-06-15 ENCOUNTER — Other Ambulatory Visit: Payer: Self-pay

## 2020-06-15 ENCOUNTER — Encounter (HOSPITAL_COMMUNITY): Payer: Self-pay | Admitting: *Deleted

## 2020-06-15 NOTE — Progress Notes (Signed)
Spoke w/ via phone for pre-op interview---patient Lab needs dos----  Urine poct             COVID test ------06-16-2020 at 955 am Arrive at -------1015 am 06-19-2020 No food after midnight clear liquids until 915 am then npo Medications to take morning of surgery -----none Diabetic medication -----n/a Patient Special Instructions -----none Pre-Op special Istructions -----none Patient verbalized understanding of instructions that were given at this phone interview. Patient denies shortness of breath, chest pain, fever, cough a this phone interview.

## 2020-06-16 ENCOUNTER — Other Ambulatory Visit (HOSPITAL_COMMUNITY)
Admission: RE | Admit: 2020-06-16 | Discharge: 2020-06-16 | Disposition: A | Discharge status: Home / Self Care | Payer: Medicaid Other | Source: Ambulatory Visit | Attending: Obstetrics and Gynecology | Admitting: Obstetrics and Gynecology

## 2020-06-16 DIAGNOSIS — Z20822 Contact with and (suspected) exposure to covid-19: Secondary | ICD-10-CM | POA: Insufficient documentation

## 2020-06-16 DIAGNOSIS — Z01812 Encounter for preprocedural laboratory examination: Secondary | ICD-10-CM | POA: Insufficient documentation

## 2020-06-16 LAB — SARS CORONAVIRUS 2 (TAT 6-24 HRS): SARS Coronavirus 2: NEGATIVE

## 2020-06-18 ENCOUNTER — Other Ambulatory Visit: Payer: Self-pay | Admitting: Obstetrics and Gynecology

## 2020-06-18 NOTE — Anesthesia Preprocedure Evaluation (Addendum)
Anesthesia Evaluation  Patient identified by MRN, date of birth, ID band Patient awake    Reviewed: Allergy & Precautions, NPO status , Patient's Chart, lab work & pertinent test results  Airway Mallampati: II  TM Distance: >3 FB Neck ROM: Full    Dental no notable dental hx. (+) Teeth Intact, Dental Advisory Given   Pulmonary asthma , former smoker,    Pulmonary exam normal breath sounds clear to auscultation       Cardiovascular negative cardio ROS Normal cardiovascular exam Rhythm:Regular Rate:Normal     Neuro/Psych  Headaches, negative psych ROS   GI/Hepatic Neg liver ROS, GERD  ,  Endo/Other  negative endocrine ROS  Renal/GU negative Renal ROS     Musculoskeletal negative musculoskeletal ROS (+)   Abdominal   Peds  Hematology   Anesthesia Other Findings   Reproductive/Obstetrics negative OB ROS                            Anesthesia Physical Anesthesia Plan  ASA: II  Anesthesia Plan: General   Post-op Pain Management:    Induction: Intravenous  PONV Risk Score and Plan: Treatment may vary due to age or medical condition, Ondansetron and Dexamethasone  Airway Management Planned: LMA  Additional Equipment: None  Intra-op Plan:   Post-operative Plan:   Informed Consent: I have reviewed the patients History and Physical, chart, labs and discussed the procedure including the risks, benefits and alternatives for the proposed anesthesia with the patient or authorized representative who has indicated his/her understanding and acceptance.     Dental advisory given  Plan Discussed with:   Anesthesia Plan Comments:        Anesthesia Quick Evaluation

## 2020-06-19 ENCOUNTER — Ambulatory Visit (HOSPITAL_BASED_OUTPATIENT_CLINIC_OR_DEPARTMENT_OTHER): Payer: Medicaid Other | Admitting: Anesthesiology

## 2020-06-19 ENCOUNTER — Ambulatory Visit (HOSPITAL_BASED_OUTPATIENT_CLINIC_OR_DEPARTMENT_OTHER)
Admission: RE | Admit: 2020-06-19 | Discharge: 2020-06-19 | Disposition: A | Discharge status: Home / Self Care | Payer: Medicaid Other | Attending: Obstetrics and Gynecology | Admitting: Obstetrics and Gynecology

## 2020-06-19 ENCOUNTER — Other Ambulatory Visit: Payer: Self-pay

## 2020-06-19 ENCOUNTER — Encounter (HOSPITAL_BASED_OUTPATIENT_CLINIC_OR_DEPARTMENT_OTHER): Payer: Self-pay | Admitting: Obstetrics and Gynecology

## 2020-06-19 ENCOUNTER — Encounter (HOSPITAL_BASED_OUTPATIENT_CLINIC_OR_DEPARTMENT_OTHER)
Admission: RE | Disposition: A | Discharge status: Home / Self Care | Payer: Self-pay | Source: Home / Self Care | Attending: Obstetrics and Gynecology

## 2020-06-19 DIAGNOSIS — J45909 Unspecified asthma, uncomplicated: Secondary | ICD-10-CM | POA: Diagnosis not present

## 2020-06-19 DIAGNOSIS — N840 Polyp of corpus uteri: Secondary | ICD-10-CM | POA: Insufficient documentation

## 2020-06-19 DIAGNOSIS — Z87891 Personal history of nicotine dependence: Secondary | ICD-10-CM | POA: Diagnosis not present

## 2020-06-19 HISTORY — PX: HYSTEROSCOPY WITH D & C: SHX1775

## 2020-06-19 LAB — TYPE AND SCREEN
ABO/RH(D): O POS
Antibody Screen: NEGATIVE

## 2020-06-19 LAB — POCT PREGNANCY, URINE: Preg Test, Ur: NEGATIVE

## 2020-06-19 LAB — ABO/RH: ABO/RH(D): O POS

## 2020-06-19 SURGERY — DILATATION AND CURETTAGE /HYSTEROSCOPY
Anesthesia: General | Site: Vagina

## 2020-06-19 MED ORDER — HYDROCODONE-ACETAMINOPHEN 5-325 MG PO TABS: ORAL_TABLET | ORAL | 0 refills | Status: DC

## 2020-06-19 MED ORDER — LACTATED RINGERS IV SOLN
INTRAVENOUS | Status: DC
  Administered 2020-06-19: 1000 mL via INTRAVENOUS

## 2020-06-19 MED ORDER — SODIUM CHLORIDE 0.9 % IR SOLN
Status: DC | PRN
  Administered 2020-06-19: 3000 mL

## 2020-06-19 MED ORDER — PROPOFOL 10 MG/ML IV BOLUS
INTRAVENOUS | Status: DC | PRN
  Administered 2020-06-19: 200 mg via INTRAVENOUS

## 2020-06-19 MED ORDER — LIDOCAINE HCL 1 % IJ SOLN
INTRAMUSCULAR | Status: DC | PRN
  Administered 2020-06-19: 10 mL

## 2020-06-19 MED ORDER — FENTANYL CITRATE (PF) 100 MCG/2ML IJ SOLN
INTRAMUSCULAR | Status: AC
  Filled 2020-06-19: qty 2

## 2020-06-19 MED ORDER — OXYCODONE HCL 5 MG PO TABS: 5.0000 mg | ORAL_TABLET | Freq: Once | ORAL | Status: DC | PRN

## 2020-06-19 MED ORDER — LIDOCAINE 2% (20 MG/ML) 5 ML SYRINGE
INTRAMUSCULAR | Status: AC
  Filled 2020-06-19: qty 5

## 2020-06-19 MED ORDER — KETOROLAC TROMETHAMINE 30 MG/ML IJ SOLN: 30.0000 mg | Freq: Once | INTRAMUSCULAR | Status: DC | PRN

## 2020-06-19 MED ORDER — ONDANSETRON HCL 4 MG/2ML IJ SOLN: 4.0000 mg | Freq: Once | INTRAMUSCULAR | Status: DC | PRN

## 2020-06-19 MED ORDER — MIDAZOLAM HCL 2 MG/2ML IJ SOLN
INTRAMUSCULAR | Status: AC
  Filled 2020-06-19: qty 2

## 2020-06-19 MED ORDER — KETOROLAC TROMETHAMINE 30 MG/ML IJ SOLN
INTRAMUSCULAR | Status: DC | PRN
  Administered 2020-06-19: 30 mg via INTRAVENOUS

## 2020-06-19 MED ORDER — IBUPROFEN 600 MG PO TABS
600.0000 mg | ORAL_TABLET | Freq: Four times a day (QID) | ORAL | 0 refills | Status: DC | PRN
Start: 2020-06-19 — End: 2020-10-18

## 2020-06-19 MED ORDER — LACTATED RINGERS IV SOLN: INTRAVENOUS | Status: DC

## 2020-06-19 MED ORDER — OXYCODONE HCL 5 MG/5ML PO SOLN: 5.0000 mg | Freq: Once | ORAL | Status: DC | PRN

## 2020-06-19 MED ORDER — PROPOFOL 10 MG/ML IV BOLUS
INTRAVENOUS | Status: AC
  Filled 2020-06-19: qty 20

## 2020-06-19 MED ORDER — MIDAZOLAM HCL 2 MG/2ML IJ SOLN
INTRAMUSCULAR | Status: DC | PRN
  Administered 2020-06-19: 2 mg via INTRAVENOUS

## 2020-06-19 MED ORDER — HYDROMORPHONE HCL 1 MG/ML IJ SOLN: 0.2500 mg | INTRAMUSCULAR | Status: DC | PRN

## 2020-06-19 MED ORDER — LIDOCAINE HCL (CARDIAC) PF 100 MG/5ML IV SOSY
PREFILLED_SYRINGE | INTRAVENOUS | Status: DC | PRN
  Administered 2020-06-19: 80 mg via INTRAVENOUS

## 2020-06-19 MED ORDER — FENTANYL CITRATE (PF) 100 MCG/2ML IJ SOLN
INTRAMUSCULAR | Status: DC | PRN
  Administered 2020-06-19 (×4): 50 ug via INTRAVENOUS

## 2020-06-19 MED ORDER — ONDANSETRON HCL 4 MG/2ML IJ SOLN
INTRAMUSCULAR | Status: DC | PRN
  Administered 2020-06-19: 4 mg via INTRAVENOUS

## 2020-06-19 MED ORDER — DEXAMETHASONE SODIUM PHOSPHATE 10 MG/ML IJ SOLN
INTRAMUSCULAR | Status: DC | PRN
  Administered 2020-06-19: 4 mg via INTRAVENOUS

## 2020-06-19 SURGICAL SUPPLY — 19 items
BIPOLAR CUTTING LOOP 21FR (ELECTRODE)
CANISTER SUCT 3000ML PPV (MISCELLANEOUS) ×4 IMPLANT
CATH ROBINSON RED A/P 16FR (CATHETERS) ×4 IMPLANT
COVER WAND RF STERILE (DRAPES) ×4 IMPLANT
DILATOR CANAL MILEX (MISCELLANEOUS) IMPLANT
GLOVE BIO SURGEON STRL SZ7 (GLOVE) ×4 IMPLANT
GOWN STRL REUS W/TWL LRG LVL3 (GOWN DISPOSABLE) ×4 IMPLANT
IV NS IRRIG 3000ML ARTHROMATIC (IV SOLUTION) ×4 IMPLANT
KIT PROCEDURE FLUENT (KITS) ×4 IMPLANT
KIT TURNOVER CYSTO (KITS) ×4 IMPLANT
LOOP CUTTING BIPOLAR 21FR (ELECTRODE) IMPLANT
MYOSURE XL FIBROID (MISCELLANEOUS) ×4
PACK VAGINAL MINOR WOMEN LF (CUSTOM PROCEDURE TRAY) ×4 IMPLANT
PAD OB MATERNITY 4.3X12.25 (PERSONAL CARE ITEMS) ×4 IMPLANT
PAD PREP 24X48 CUFFED NSTRL (MISCELLANEOUS) ×4 IMPLANT
SEAL ROD LENS SCOPE MYOSURE (ABLATOR) ×4 IMPLANT
SYSTEM TISS REMOVAL MYOSURE XL (MISCELLANEOUS) ×2 IMPLANT
TOWEL OR 17X26 10 PK STRL BLUE (TOWEL DISPOSABLE) ×4 IMPLANT
WATER STERILE IRR 500ML POUR (IV SOLUTION) ×4 IMPLANT

## 2020-06-19 NOTE — Transfer of Care (Signed)
Immediate Anesthesia Transfer of Care Note  Patient: Joanna Mcpherson  Procedure(s) Performed: DILATATION AND CURETTAGE /HYSTEROSCOPY WITH HYSTEROSCOPIC MYOMECTOMY (N/A Vagina )  Patient Location: PACU  Anesthesia Type:General  Level of Consciousness: awake, drowsy and patient cooperative  Airway & Oxygen Therapy: Patient Spontanous Breathing and Patient connected to face mask oxygen  Post-op Assessment: Report given to RN and Post -op Vital signs reviewed and stable  Post vital signs: Reviewed and stable  Last Vitals:  Vitals Value Taken Time  BP 106/68 06/19/20 1304  Temp    Pulse 77 06/19/20 1307  Resp 18 06/19/20 1307  SpO2 100 % 06/19/20 1307  Vitals shown include unvalidated device data.  Last Pain:  Vitals:   06/19/20 1038  TempSrc: Oral  PainSc: 3       Patients Stated Pain Goal: 6 (06/19/20 1038)  Complications: No complications documented.

## 2020-06-19 NOTE — H&P (Signed)
Joanna Mcpherson is a 34 y.o. female presenting for hysteroscopy  34 yo presents for hysteroscopy D&C for suspected endometrial polyp. The patient's recent history has been complicated by a miscarriage. After the spontaneous miscarriage the endometrial lining appeared to be thickened and there was concern for retained products of conception. Pt did a repeat course of misoprostal and her quant dropped from 234 to 5 but the endometrial abnormality remained and appeared more polypoid. Management options discussed with patient and she wishes to proceed with hysteroscopy, D&C  OB History    Gravida  5   Para  3   Term  3   Preterm      AB  1   Living  3     SAB      TAB  1   Ectopic      Multiple  0   Live Births  3          Past Medical History:  Diagnosis Date  . Abnormal Pap smear   . Asthma    no issue in 30 yrs  . Headache(784.0)   . Hx of migraines last may 2021  . Miscarriage 04/2020   Past Surgical History:  Procedure Laterality Date  . ANKLE FRACTURE SURGERY Left 14 yrs ago  . CERVICAL CONE BIOPSY    . elective abortion     Family History: family history includes Diabetes in her mother. Social History:  reports that she quit smoking about 2 years ago. Her smoking use included cigarettes. She has a 3.00 pack-year smoking history. She has never used smokeless tobacco. She reports previous alcohol use. She reports that she does not use drugs.      Review of Systems History   Blood pressure 126/80, pulse 67, temperature 98.4 F (36.9 C), temperature source Oral, resp. rate 14, height 5\' 6"  (1.676 m), weight 89.4 kg, last menstrual period 06/13/2020, SpO2 100 %, unknown if currently breastfeeding. Exam Physical Exam   AOx3, NAD Normal work breathing Prenatal labs: ABO, Rh: --/--/PENDING (07/02 1025) Antibody: PENDING (07/02 1025) RPR: NON REACTIVE (08/27 09-17-1990)  GBS: Negative/-- (07/29 0000)   Assessment/Plan: 1) Admit 2) SCDs 3) Proceed with  Hysteroscopy, D&C  04-06-1990 06/19/2020, 11:26 AM

## 2020-06-19 NOTE — Op Note (Signed)
Pre-Operative Diagnosis: 1) Suspected endometrial polyp Postoperative Diagnosis: 1) Submucosal fibroid Procedure: Hysteroscopy, dilation and curettage, hysteroscopic myomectomy with myosure XL Surgeon: Dr. Waynard Reeds Assistant: None Operative Findings: Endometrial cavity with bilateral tubal ostia visualized . Midline-posterior endometrial wall there is a 1 cm irregular appearing mass. Visually this is more consistent with a fibroid than endometrial polyp. On sharp curettage it is palpably firm. This was resected with the myosure XL. At the end of the procedure the posterior wall of the endometrial cavity was smooth. Fluid deficit 1450 cc Specimen: Endometrial curettings and pieces of submucosal fibroid EBL: Total I/O In: 900 [I.V.:900] Out: 310 [Urine:300; Blood:10]   Ms. Perine presents for definitive surgical management for a suspected endometrial polyp. Please see the patient's history and physical for complete details of the history. Management options were discussed with the patient. R/B/A reviewed. Following appropriate informed consent was taken to the operating room. The patient was appropriately identified during a time out procedure. General LMA anesthesia was administered and the patient was placed in the dorsal lithotomy position. The patient was prepped and draped in the normal sterile fashion. A speculum was placed into the vagina, a single-tooth tenaculum was placed on the anterior lip of the cervix, and 10 cc of 1% lidocaine was administered in a paracervical fashion. The cervix was serially dilated with Hank dilators. The hysteroscope was introduced for the above findings.  Initially, the case was started with a 2.9 mm hysteroscope.  A sharp curettage was performed and the hysteroscope was reintroduced.  A portion of the submucosal fibroid was resected with sharp curettage however 75% remained and the decision was made to change over to the MyoSure XL scope.  Prior to changing to the  new scope the fluid deficit was 315 cc.  With the MyoSure XL scope the submucosal fibroid was resected with direct visualization until the posterior wall of the endometrial cavity was smooth.  This completed the surgical procedure.  Final fluid deficit was 1450 cc.  All sponge, lap, needle counts were correct.  The patient was transferred to the PACU in stable condition after extubation in the operating room.

## 2020-06-19 NOTE — Anesthesia Procedure Notes (Signed)
Procedure Name: LMA Insertion Date/Time: 06/19/2020 12:11 PM Performed by: Yolonda Kida, CRNA Pre-anesthesia Checklist: Patient identified, Emergency Drugs available, Suction available and Patient being monitored Patient Re-evaluated:Patient Re-evaluated prior to induction Oxygen Delivery Method: Circle system utilized Preoxygenation: Pre-oxygenation with 100% oxygen Induction Type: IV induction LMA: LMA inserted LMA Size: 4.0 Number of attempts: 1 Placement Confirmation: positive ETCO2 and breath sounds checked- equal and bilateral Tube secured with: Tape Dental Injury: Teeth and Oropharynx as per pre-operative assessment

## 2020-06-19 NOTE — Anesthesia Postprocedure Evaluation (Signed)
Anesthesia Post Note  Patient: Joanna Mcpherson  Procedure(s) Performed: DILATATION AND CURETTAGE /HYSTEROSCOPY WITH HYSTEROSCOPIC MYOMECTOMY (N/A Vagina )     Patient location during evaluation: PACU Anesthesia Type: General Level of consciousness: awake and alert Pain management: pain level controlled Vital Signs Assessment: post-procedure vital signs reviewed and stable Respiratory status: spontaneous breathing, nonlabored ventilation, respiratory function stable and patient connected to nasal cannula oxygen Cardiovascular status: blood pressure returned to baseline and stable Postop Assessment: no apparent nausea or vomiting Anesthetic complications: no   No complications documented.  Last Vitals:  Vitals:   06/19/20 1330 06/19/20 1335  BP: 107/66   Pulse: 72 73  Resp: 19 18  Temp:    SpO2: 95% 96%    Last Pain:  Vitals:   06/19/20 1335  TempSrc:   PainSc: 0-No pain                 Trevor Iha

## 2020-06-19 NOTE — Discharge Instructions (Signed)
  Post Anesthesia Home Care Instructions  Activity: Get plenty of rest for the remainder of the day. A responsible adult should stay with you for 24 hours following the procedure.  For the next 24 hours, DO NOT: -Drive a car -Operate machinery -Drink alcoholic beverages -Take any medication unless instructed by your physician -Make any legal decisions or sign important papers.  Meals: Start with liquid foods such as gelatin or soup. Progress to regular foods as tolerated. Avoid greasy, spicy, heavy foods. If nausea and/or vomiting occur, drink only clear liquids until the nausea and/or vomiting subsides. Call your physician if vomiting continues.  Special Instructions/Symptoms: Your throat may feel dry or sore from the anesthesia or the breathing tube placed in your throat during surgery. If this causes discomfort, gargle with warm salt water. The discomfort should disappear within 24 hours.  If you had a scopolamine patch placed behind your ear for the management of post- operative nausea and/or vomiting:  1. The medication in the patch is effective for 72 hours, after which it should be removed.  Wrap patch in a tissue and discard in the trash. Wash hands thoroughly with soap and water. 2. You may remove the patch earlier than 72 hours if you experience unpleasant side effects which may include dry mouth, dizziness or visual disturbances. 3. Avoid touching the patch. Wash your hands with soap and water after contact with the patch.   DISCHARGE INSTRUCTIONS: D&C / D&E The following instructions have been prepared to help you care for yourself upon your return home.   Personal hygiene: . Use sanitary pads for vaginal drainage, not tampons. . Shower the day after your procedure. . NO tub baths, pools or Jacuzzis for 2-3 weeks. . Wipe front to back after using the bathroom.  Activity and limitations: . Do NOT drive or operate any equipment for 24 hours. The effects of anesthesia are  still present and drowsiness may result. . Do NOT rest in bed all day. . Walking is encouraged. . Walk up and down stairs slowly. . You may resume your normal activity in one to two days or as indicated by your physician.  Sexual activity: NO intercourse for at least 2 weeks after the procedure, or as indicated by your physician.  Diet: Eat a light meal as desired this evening. You may resume your usual diet tomorrow.  Return to work: You may resume your work activities in one to two days or as indicated by your doctor.  What to expect after your surgery: Expect to have vaginal bleeding/discharge for 2-3 days and spotting for up to 10 days. It is not unusual to have soreness for up to 1-2 weeks. You may have a slight burning sensation when you urinate for the first day. Mild cramps may continue for a couple of days. You may have a regular period in 2-6 weeks.  Call your doctor for any of the following: . Excessive vaginal bleeding, saturating and changing one pad every hour. . Inability to urinate 6 hours after discharge from hospital. . Pain not relieved by pain medication. . Fever of 100.4 F or greater. . Unusual vaginal discharge or odor.   Call for an appointment:      

## 2020-06-23 ENCOUNTER — Encounter (HOSPITAL_BASED_OUTPATIENT_CLINIC_OR_DEPARTMENT_OTHER): Payer: Self-pay | Admitting: Obstetrics and Gynecology

## 2020-06-23 LAB — SURGICAL PATHOLOGY

## 2020-10-18 ENCOUNTER — Inpatient Hospital Stay (HOSPITAL_COMMUNITY): Payer: Medicaid Other | Admitting: Anesthesiology

## 2020-10-18 ENCOUNTER — Other Ambulatory Visit: Payer: Self-pay

## 2020-10-18 ENCOUNTER — Encounter (HOSPITAL_COMMUNITY): Payer: Self-pay | Admitting: Obstetrics and Gynecology

## 2020-10-18 ENCOUNTER — Inpatient Hospital Stay (HOSPITAL_COMMUNITY)
Admission: AD | Admit: 2020-10-18 | Discharge: 2020-10-18 | Disposition: A | Discharge status: Home / Self Care | Payer: Medicaid Other | Attending: Obstetrics and Gynecology | Admitting: Obstetrics and Gynecology

## 2020-10-18 ENCOUNTER — Encounter (HOSPITAL_COMMUNITY)
Admission: AD | Disposition: A | Discharge status: Home / Self Care | Payer: Self-pay | Source: Home / Self Care | Attending: Obstetrics and Gynecology

## 2020-10-18 ENCOUNTER — Inpatient Hospital Stay (HOSPITAL_COMMUNITY): Payer: Medicaid Other

## 2020-10-18 DIAGNOSIS — Z87891 Personal history of nicotine dependence: Secondary | ICD-10-CM | POA: Diagnosis not present

## 2020-10-18 DIAGNOSIS — Z20822 Contact with and (suspected) exposure to covid-19: Secondary | ICD-10-CM | POA: Insufficient documentation

## 2020-10-18 DIAGNOSIS — Z3A11 11 weeks gestation of pregnancy: Secondary | ICD-10-CM | POA: Diagnosis not present

## 2020-10-18 DIAGNOSIS — O021 Missed abortion: Secondary | ICD-10-CM | POA: Diagnosis present

## 2020-10-18 DIAGNOSIS — O039 Complete or unspecified spontaneous abortion without complication: Secondary | ICD-10-CM

## 2020-10-18 DIAGNOSIS — O209 Hemorrhage in early pregnancy, unspecified: Secondary | ICD-10-CM

## 2020-10-18 HISTORY — PX: DILATION AND EVACUATION: SHX1459

## 2020-10-18 LAB — URINALYSIS, ROUTINE W REFLEX MICROSCOPIC
Bilirubin Urine: NEGATIVE
Glucose, UA: NEGATIVE mg/dL
Hgb urine dipstick: NEGATIVE
Ketones, ur: NEGATIVE mg/dL
Leukocytes,Ua: NEGATIVE
Nitrite: NEGATIVE
Protein, ur: NEGATIVE mg/dL
Specific Gravity, Urine: 1.013 (ref 1.005–1.030)
pH: 7 (ref 5.0–8.0)

## 2020-10-18 LAB — WET PREP, GENITAL
Sperm: NONE SEEN
Trich, Wet Prep: NONE SEEN
Yeast Wet Prep HPF POC: NONE SEEN

## 2020-10-18 LAB — POCT PREGNANCY, URINE: Preg Test, Ur: POSITIVE — AB

## 2020-10-18 LAB — CBC
HCT: 42.2 % (ref 36.0–46.0)
Hemoglobin: 13.9 g/dL (ref 12.0–15.0)
MCH: 30.5 pg (ref 26.0–34.0)
MCHC: 32.9 g/dL (ref 30.0–36.0)
MCV: 92.5 fL (ref 80.0–100.0)
Platelets: 217 10*3/uL (ref 150–400)
RBC: 4.56 MIL/uL (ref 3.87–5.11)
RDW: 15.4 % (ref 11.5–15.5)
WBC: 5.8 10*3/uL (ref 4.0–10.5)
nRBC: 0 % (ref 0.0–0.2)

## 2020-10-18 LAB — TYPE AND SCREEN
ABO/RH(D): O POS
Antibody Screen: NEGATIVE

## 2020-10-18 LAB — RESPIRATORY PANEL BY RT PCR (FLU A&B, COVID)
Influenza A by PCR: NEGATIVE
Influenza B by PCR: NEGATIVE
SARS Coronavirus 2 by RT PCR: NEGATIVE

## 2020-10-18 SURGERY — DILATION AND EVACUATION, UTERUS
Anesthesia: General | Site: Vagina

## 2020-10-18 MED ORDER — SUCCINYLCHOLINE CHLORIDE 200 MG/10ML IV SOSY
PREFILLED_SYRINGE | INTRAVENOUS | Status: DC | PRN
  Administered 2020-10-18: 140 mg via INTRAVENOUS

## 2020-10-18 MED ORDER — ONDANSETRON HCL 4 MG/2ML IJ SOLN
INTRAMUSCULAR | Status: AC
  Filled 2020-10-18: qty 2

## 2020-10-18 MED ORDER — PROPOFOL 10 MG/ML IV BOLUS
INTRAVENOUS | Status: DC | PRN
  Administered 2020-10-18: 150 mg via INTRAVENOUS

## 2020-10-18 MED ORDER — ACETAMINOPHEN 650 MG RE SUPP: 650.0000 mg | RECTAL | Status: DC | PRN

## 2020-10-18 MED ORDER — SODIUM CHLORIDE 0.9 % IR SOLN
Status: DC | PRN
  Administered 2020-10-18: 1000 mL

## 2020-10-18 MED ORDER — LACTATED RINGERS IV SOLN: INTRAVENOUS | Status: DC

## 2020-10-18 MED ORDER — METRONIDAZOLE 500 MG PO TABS: 500.0000 mg | ORAL_TABLET | Freq: Two times a day (BID) | ORAL | 0 refills | Status: AC

## 2020-10-18 MED ORDER — FENTANYL CITRATE (PF) 250 MCG/5ML IJ SOLN
INTRAMUSCULAR | Status: DC | PRN
  Administered 2020-10-18 (×2): 50 ug via INTRAVENOUS
  Administered 2020-10-18: 25 ug via INTRAVENOUS

## 2020-10-18 MED ORDER — ACETAMINOPHEN 325 MG PO TABS: 650.0000 mg | ORAL_TABLET | ORAL | Status: DC | PRN

## 2020-10-18 MED ORDER — LIDOCAINE 2% (20 MG/ML) 5 ML SYRINGE
INTRAMUSCULAR | Status: DC | PRN
  Administered 2020-10-18: 100 mg via INTRAVENOUS

## 2020-10-18 MED ORDER — SOD CITRATE-CITRIC ACID 500-334 MG/5ML PO SOLN: 30.0000 mL | Freq: Once | ORAL | Status: DC

## 2020-10-18 MED ORDER — OXYCODONE HCL 5 MG PO TABS: 5.0000 mg | ORAL_TABLET | ORAL | Status: DC | PRN

## 2020-10-18 MED ORDER — PROPOFOL 10 MG/ML IV BOLUS
INTRAVENOUS | Status: AC
  Filled 2020-10-18: qty 20

## 2020-10-18 MED ORDER — MIDAZOLAM HCL 2 MG/2ML IJ SOLN
INTRAMUSCULAR | Status: AC
  Filled 2020-10-18: qty 2

## 2020-10-18 MED ORDER — LACTATED RINGERS IV BOLUS
1000.0000 mL | Freq: Once | INTRAVENOUS | Status: AC
  Administered 2020-10-18: 1000 mL via INTRAVENOUS

## 2020-10-18 MED ORDER — FAMOTIDINE IN NACL 20-0.9 MG/50ML-% IV SOLN: 20.0000 mg | Freq: Once | INTRAVENOUS | Status: DC

## 2020-10-18 MED ORDER — LIDOCAINE HCL 1 % IJ SOLN
INTRAMUSCULAR | Status: AC
  Filled 2020-10-18: qty 20

## 2020-10-18 MED ORDER — DOXYCYCLINE HYCLATE 100 MG IV SOLR
200.0000 mg | Freq: Once | INTRAVENOUS | Status: AC
  Administered 2020-10-18: 200 mg via INTRAVENOUS
  Filled 2020-10-18 (×2): qty 200

## 2020-10-18 MED ORDER — LIDOCAINE 2% (20 MG/ML) 5 ML SYRINGE
INTRAMUSCULAR | Status: AC
  Filled 2020-10-18: qty 5

## 2020-10-18 MED ORDER — MIDAZOLAM HCL 2 MG/2ML IJ SOLN
INTRAMUSCULAR | Status: DC | PRN
  Administered 2020-10-18: 2 mg via INTRAVENOUS

## 2020-10-18 MED ORDER — DEXAMETHASONE SODIUM PHOSPHATE 10 MG/ML IJ SOLN
INTRAMUSCULAR | Status: AC
  Filled 2020-10-18: qty 1

## 2020-10-18 MED ORDER — IBUPROFEN 600 MG PO TABS: 600.0000 mg | ORAL_TABLET | Freq: Four times a day (QID) | ORAL | 0 refills | Status: DC | PRN

## 2020-10-18 MED ORDER — FENTANYL CITRATE (PF) 250 MCG/5ML IJ SOLN
INTRAMUSCULAR | Status: AC
  Filled 2020-10-18: qty 5

## 2020-10-18 MED ORDER — ACETAMINOPHEN 500 MG PO TABS
1000.0000 mg | ORAL_TABLET | Freq: Once | ORAL | Status: AC
  Administered 2020-10-18: 1000 mg via ORAL
  Filled 2020-10-18: qty 2

## 2020-10-18 MED ORDER — DEXAMETHASONE SODIUM PHOSPHATE 10 MG/ML IJ SOLN
INTRAMUSCULAR | Status: DC | PRN
  Administered 2020-10-18: 10 mg via INTRAVENOUS

## 2020-10-18 MED ORDER — ONDANSETRON HCL 4 MG/2ML IJ SOLN
INTRAMUSCULAR | Status: DC | PRN
  Administered 2020-10-18: 4 mg via INTRAVENOUS

## 2020-10-18 SURGICAL SUPPLY — 24 items
CATH ROBINSON RED A/P 16FR (CATHETERS) ×3 IMPLANT
DECANTER SPIKE VIAL GLASS SM (MISCELLANEOUS) ×3 IMPLANT
DILATOR CANAL MILEX (MISCELLANEOUS) IMPLANT
FILTER UTR ASPR ASSEMBLY (MISCELLANEOUS) ×3 IMPLANT
GLOVE BIOGEL PI IND STRL 6.5 (GLOVE) ×1 IMPLANT
GLOVE BIOGEL PI IND STRL 7.0 (GLOVE) ×1 IMPLANT
GLOVE BIOGEL PI INDICATOR 6.5 (GLOVE) ×2
GLOVE BIOGEL PI INDICATOR 7.0 (GLOVE) ×2
GOWN STRL REUS W/ TWL LRG LVL3 (GOWN DISPOSABLE) ×2 IMPLANT
GOWN STRL REUS W/TWL LRG LVL3 (GOWN DISPOSABLE) ×6
HOSE CONNECTING 18IN BERKELEY (TUBING) ×3 IMPLANT
KIT BERKELEY 1ST TRI 3/8 NO TR (MISCELLANEOUS) ×3 IMPLANT
KIT BERKELEY 1ST TRIMESTER 3/8 (MISCELLANEOUS) ×3 IMPLANT
NS IRRIG 1000ML POUR BTL (IV SOLUTION) ×3 IMPLANT
PACK VAGINAL MINOR WOMEN LF (CUSTOM PROCEDURE TRAY) ×3 IMPLANT
PAD OB MATERNITY 4.3X12.25 (PERSONAL CARE ITEMS) ×3 IMPLANT
SET BERKELEY SUCTION TUBING (SUCTIONS) ×3 IMPLANT
SPECIMEN JAR MEDIUM (MISCELLANEOUS) ×3 IMPLANT
TOWEL GREEN STERILE FF (TOWEL DISPOSABLE) ×6 IMPLANT
UNDERPAD 30X36 HEAVY ABSORB (UNDERPADS AND DIAPERS) ×3 IMPLANT
VACURETTE 10 RIGID CVD (CANNULA) IMPLANT
VACURETTE 7MM CVD STRL WRAP (CANNULA) IMPLANT
VACURETTE 8 RIGID CVD (CANNULA) IMPLANT
VACURETTE 9 RIGID CVD (CANNULA) ×3 IMPLANT

## 2020-10-18 NOTE — Anesthesia Procedure Notes (Signed)
Procedure Name: Intubation Date/Time: 10/18/2020 4:10 PM Performed by: Clearnce Sorrel, CRNA Pre-anesthesia Checklist: Patient identified, Emergency Drugs available, Suction available, Patient being monitored and Timeout performed Patient Re-evaluated:Patient Re-evaluated prior to induction Oxygen Delivery Method: Circle system utilized Preoxygenation: Pre-oxygenation with 100% oxygen Induction Type: IV induction, Rapid sequence and Cricoid Pressure applied Laryngoscope Size: Mac and 3 Grade View: Grade I Tube type: Oral Tube size: 7.0 mm Number of attempts: 1 Airway Equipment and Method: Stylet Placement Confirmation: ETT inserted through vocal cords under direct vision,  positive ETCO2 and breath sounds checked- equal and bilateral Secured at: 22 cm Tube secured with: Tape Dental Injury: Teeth and Oropharynx as per pre-operative assessment

## 2020-10-18 NOTE — Brief Op Note (Signed)
10/18/2020  4:53 PM  PATIENT:  Joanna Mcpherson  34 y.o. female  PRE-OPERATIVE DIAGNOSIS:  Missed abortion  POST-OPERATIVE DIAGNOSIS:  Missed abortion  PROCEDURE:  Procedure(s): DILATATION AND EVACUATION (N/A)  SURGEON:  Surgeon(s) and Role:    * Anedra Penafiel, Terrance Mass, MD - Primary  PHYSICIAN ASSISTANT:   ASSISTANTS: None  ANESTHESIA:   general  EBL:  50 mL   BLOOD ADMINISTERED:none  DRAINS: none   LOCAL MEDICATIONS USED:  NONE  SPECIMEN:  Source of Specimen:  suction curettings  DISPOSITION OF SPECIMEN:  PATHOLOGY  COUNTS:  YES  TOURNIQUET:  * No tourniquets in log *  DICTATION: .Note written in EPIC  PLAN OF CARE: Discharge to home after PACU  PATIENT DISPOSITION:  PACU - hemodynamically stable.   Delay start of Pharmacological VTE agent (>24hrs) due to surgical blood loss or risk of bleeding: no

## 2020-10-18 NOTE — H&P (Signed)
Joanna Mcpherson is an 34 y.o. [phone] @ [redacted]w[redacted]d by LMP who presented to MAU with lower abdominal cramping and light vaginal spotting that started last night.  Previous in office ultrasound on 09/29/2020 showed IUP at [redacted]w[redacted]d with FHR 159bpm and small subchorionic collection.   Pertinent Gynecological History: Previous GYN Procedures: D&C July 2021  [facility] biopsy 2008  Past Medical History:  Diagnosis Date  . Abnormal Pap smear   . Asthma    no issue in 30 yrs  . Headache(784.0)   . Hx of migraines last may 2021  . Miscarriage 04/2020    Past Surgical History:  Procedure Laterality Date  . ANKLE FRACTURE SURGERY Left 14 yrs ago  . CERVICAL CONE BIOPSY    . elective abortion    . HYSTEROSCOPY WITH D & C N/A 06/19/2020   Procedure: DILATATION AND CURETTAGE /HYSTEROSCOPY WITH HYSTEROSCOPIC MYOMECTOMY;  Surgeon: Waynard Reeds, MD;  Location: [facility];  Service: Gynecology;  Laterality: N/A;    Family History  Problem Relation Age of Onset  . Diabetes Mother     Social History:  reports that she quit smoking about 2 years ago. Her smoking use included cigarettes. She has a 3.00 pack-year smoking history. She has never used smokeless tobacco. She reports previous alcohol use. She reports that she does not use drugs.  Allergies: No Known Allergies  Medications Prior to Admission  Medication Sig Dispense Refill Last Dose  . HYDROcodone-acetaminophen (NORCO/VICODIN) 5-325 MG tablet 1-2 tablets every 4-6 hours as needed for pain 10 tablet 0   . ibuprofen (ADVIL) 600 MG tablet Take 1 tablet (600 mg total) by mouth every 6 (six) hours as needed. 90 tablet 0     Review of Systems  Constitutional: Positive for fatigue. Negative for chills and fever.  Eyes: Positive for photophobia.  Respiratory: Negative for shortness of breath.   Cardiovascular: Negative for chest pain.  Gastrointestinal: Positive for abdominal pain.  Genitourinary: Positive for vaginal bleeding.    Blood  pressure 117/79, pulse 93, temperature 98.6 F (37 C), temperature source Oral, resp. rate 16, height 5\' 6"  (1.676 m), weight 86.6 kg, last menstrual period 08/01/2020, SpO2 100 %, unknown if currently breastfeeding. Physical Exam Constitutional:      General: She is not in acute distress. Cardiovascular:     Rate and Rhythm: Normal rate and regular rhythm.  Pulmonary:     Breath sounds: Normal breath sounds.  Abdominal:     Palpations: Abdomen is soft.  Neurological:     Mental Status: She is alert.     Results for orders placed or performed during the hospital encounter of 10/18/20 (from the past 24 hour(s))  Pregnancy, urine POC     Status: Abnormal   Collection Time: 10/18/20 11:01 AM  Result Value Ref Range   Preg Test, Ur POSITIVE (A) NEGATIVE  Urinalysis, Routine w reflex microscopic Urine, Clean Catch     Status: Abnormal   Collection Time: 10/18/20 11:07 AM  Result Value Ref Range   Color, Urine YELLOW YELLOW   APPearance CLOUDY (A) CLEAR   Specific Gravity, Urine 1.013 1.005 - 1.030   pH 7.0 5.0 - 8.0   Glucose, UA NEGATIVE NEGATIVE mg/dL   Hgb urine dipstick NEGATIVE NEGATIVE   Bilirubin Urine NEGATIVE NEGATIVE   Ketones, ur NEGATIVE NEGATIVE mg/dL   Protein, ur NEGATIVE NEGATIVE mg/dL   Nitrite NEGATIVE NEGATIVE   Leukocytes,Ua NEGATIVE NEGATIVE  Wet prep, genital     Status: Abnormal  Collection Time: 10/18/20 12:26 PM   Specimen: PATH Cytology Cervicovaginal Ancillary Only  Result Value Ref Range   Yeast Wet Prep HPF POC NONE SEEN NONE SEEN   Trich, Wet Prep NONE SEEN NONE SEEN   Clue Cells Wet Prep HPF POC PRESENT (A) NONE SEEN   WBC, Wet Prep HPF POC MANY (A) NONE SEEN   Sperm NONE SEEN   Respiratory Panel by RT PCR (Flu A&B, Covid) - Nasopharyngeal Swab     Status: None   Collection Time: 10/18/20  2:00 PM   Specimen: Nasopharyngeal Swab  Result Value Ref Range   SARS Coronavirus 2 by RT PCR NEGATIVE NEGATIVE   Influenza A by PCR NEGATIVE NEGATIVE    Influenza B by PCR NEGATIVE NEGATIVE  Type and screen [facility]     Status: None   Collection Time: 10/18/20  2:40 PM  Result Value Ref Range   ABO/RH(D) O POS    Antibody Screen NEG    Sample Expiration      10/21/2020,2359 Performed at [facility], [address], [place], [place] [address]   CBC     Status: None   Collection Time: 10/18/20  2:45 PM  Result Value Ref Range   WBC 5.8 4.0 - 10.5 K/uL   RBC 4.56 3.87 - 5.11 MIL/uL   Hemoglobin 13.9 12.0 - 15.0 g/dL   HCT 58.5 36 - 46 %   MCV 92.5 80.0 - 100.0 fL   MCH 30.5 26.0 - 34.0 pg   MCHC 32.9 30.0 - 36.0 g/dL   RDW 27.7 82.4 - 23.5 %   Platelets 217 150 - 400 K/uL   nRBC 0.0 0.0 - 0.2 %    US OB Comp Less 14 Wks  Result Date: 10/18/2020 CLINICAL DATA:  Vaginal bleeding, lower abdominal pain. Gestational age by last menstrual period is 11 weeks 1 day. EXAM: OBSTETRIC <14 WK ULTRASOUND TECHNIQUE: Transabdominal ultrasound was performed for evaluation of the gestation as well as the maternal uterus and adnexal regions. COMPARISON:  Obstetric ultrasound dated 05/13/2020. FINDINGS: Intrauterine gestational sac: Single Yolk sac:  Not Visualized. Embryo:  Visualized. Cardiac Activity: Not Visualized. CRL:   22.8 mm   8 w 6 d                  [place] EDC: 05/24/2021 Subchorionic hemorrhage: Small subchorionic hemorrhage is visualized. Maternal uterus/adnexae: Normal appearing. No free fluid. IMPRESSION: No evidence of fetal cardiac activity on M-mode, grey scale cine and color Doppler, diagnostic of intrauterine fetal demise. These results were called by telephone at the time of interpretation on 10/18/2020 at 1:16 pm to provider Lynnda Shields, who verbally acknowledged these results. Electronically Signed   By: Romona Curls M.D.   On: 10/18/2020 13:18    Assessment/Plan: 33Y [id number] @ [redacted]w[redacted]d by LMP, now with [place] today diagnostic of early pregnancy loss at ~ [redacted]w[redacted]d, having minimal vaginal bleeding on MAU exam.  Diagnosis: missed abortion - Reviewed management options with patient including expectant management, medical management with misoprostol, and surgical management with D&C. Discussed risks/benefits of each. She is adamant that she wants a D&C because she had a negative experience with misoprostol previously. Risks of D&C including bleeding, infection, uterine perforation, damage to surrounding organs reviewed. Consent signed.  - Declined cytogenetics - will send POCs for routine pathology - Blood type is O+ - Covid test neg - Doxycycline 200mg  IV perioperatively - + BV on wet prep in MAU: will send home  with rx for flagyl  Charlett Nose 10/18/2020, 3:59 PM

## 2020-10-18 NOTE — MAU Note (Signed)
Report given to Richard (anesthesia).

## 2020-10-18 NOTE — Transfer of Care (Signed)
Immediate Anesthesia Transfer of Care Note  Patient: Joanna Mcpherson  Procedure(s) Performed: DILATATION AND EVACUATION (N/A Vagina )  Patient Location: PACU  Anesthesia Type:General  Level of Consciousness: awake, alert  and oriented  Airway & Oxygen Therapy: Patient Spontanous Breathing  Post-op Assessment: Report given to RN and Post -op Vital signs reviewed and stable  Post vital signs: Reviewed and stable  Last Vitals:  Vitals Value Taken Time  BP 115/67 10/18/20 1655  Temp    Pulse 83 10/18/20 1657  Resp 17 10/18/20 1657  SpO2 100 % 10/18/20 1657  Vitals shown include unvalidated device data.  Last Pain:  Vitals:   10/18/20 1110  TempSrc: Oral         Complications: No complications documented.

## 2020-10-18 NOTE — Anesthesia Postprocedure Evaluation (Signed)
Anesthesia Post Note  Patient: Joanna Mcpherson  Procedure(s) Performed: DILATATION AND EVACUATION (N/A Vagina )     Patient location during evaluation: PACU Anesthesia Type: General Level of consciousness: awake and alert Pain management: pain level controlled Vital Signs Assessment: post-procedure vital signs reviewed and stable Respiratory status: spontaneous breathing, nonlabored ventilation, respiratory function stable and patient connected to nasal cannula oxygen Cardiovascular status: blood pressure returned to baseline and stable Postop Assessment: no apparent nausea or vomiting Anesthetic complications: no   No complications documented.  Last Vitals:  Vitals:   10/18/20 1710 10/18/20 1725  BP: 116/71 111/70  Pulse: 82 78  Resp: 20 16  Temp:  36.7 C  SpO2: 100% 100%    Last Pain:  Vitals:   10/18/20 1725  TempSrc:   PainSc: 0-No pain                 [name]

## 2020-10-18 NOTE — MAU Provider Note (Addendum)
Chief Complaint: Vaginal Bleeding and Vaginal Discharge  SUBJECTIVE HPI: Joanna Mcpherson is a 34 y.o. [id number] at [redacted]w[redacted]d by LMP who presents to maternity admissions reporting concern of vaginal spotting since yesterday afternoon in addition to left lower abdominal pain. Pt reports prior ultrasound 2 weeks ago that confirmed intrauterine pregnancy with +FHTs. Pt very concerned given recent history of miscarriage in July 2021 with subsequent D&C on 7/22.  She denies vaginal itching/burning, urinary symptoms, h/a, dizziness, n/v, or fever/chills.    Past Medical History:  Diagnosis Date  . Abnormal Pap smear   . Asthma    no issue in 30 yrs  . Headache(784.0)   . Hx of migraines last may 2021  . Miscarriage 04/2020   Past Surgical History:  Procedure Laterality Date  . ANKLE FRACTURE SURGERY Left 14 yrs ago  . CERVICAL CONE BIOPSY    . elective abortion    . HYSTEROSCOPY WITH D & C N/A 06/19/2020   Procedure: DILATATION AND CURETTAGE /HYSTEROSCOPY WITH HYSTEROSCOPIC MYOMECTOMY;  Surgeon: Waynard Reeds, MD;  Location: [facility];  Service: Gynecology;  Laterality: N/A;   Social History   Socioeconomic History  . Marital status: Divorced    Spouse name: Chauntelle Azpeitia  . Number of children: 2  . Years of education: Not on file  . Highest education level: Not on file  Occupational History  . Occupation: Clinical biochemist Rep    Employer: [employer]  Tobacco Use  . Smoking status: Former Smoker    Packs/day: 0.50    Years: 6.00    Pack years: 3.00    Types: Cigarettes    Quit date: 12/19/2017    Years since quitting: 2.8  . Smokeless tobacco: Never Used  Vaping Use  . Vaping Use: Never used  Substance and Sexual Activity  . Alcohol use: Not Currently    Alcohol/week: 0.0 standard drinks  . Drug use: No  . Sexual activity: Yes    Partners: Male  Other Topics Concern  . Not on file  Social History Narrative  . Not on file   Social Determinants of Health   Financial  Resource Strain:   . Difficulty of Paying Living Expenses: Not on file  Food Insecurity:   . Worried About Programme researcher, broadcasting/film/video in the Last Year: Not on file  . Ran Out of Food in the Last Year: Not on file  Transportation Needs:   . Lack of Transportation (Medical): Not on file  . Lack of Transportation (Non-Medical): Not on file  Physical Activity:   . Days of Exercise per Week: Not on file  . Minutes of Exercise per Session: Not on file  Stress:   . Feeling of Stress : Not on file  Social Connections:   . Frequency of Communication with Friends and Family: Not on file  . Frequency of Social Gatherings with Friends and Family: Not on file  . Attends Religious Services: Not on file  . Active Member of Clubs or Organizations: Not on file  . Attends Banker Meetings: Not on file  . Marital Status: Not on file  Intimate Partner Violence:   . Fear of Current or Ex-Partner: Not on file  . Emotionally Abused: Not on file  . Physically Abused: Not on file  . Sexually Abused: Not on file   No current facility-administered medications on file prior to encounter.   Current Outpatient Medications on File Prior to Encounter  Medication Sig Dispense Refill  . HYDROcodone-acetaminophen (  NORCO/VICODIN) 5-325 MG tablet 1-2 tablets every 4-6 hours as needed for pain 10 tablet 0  . ibuprofen (ADVIL) 600 MG tablet Take 1 tablet (600 mg total) by mouth every 6 (six) hours as needed. 90 tablet 0   No Known Allergies  ROS:  Review of Systems  Constitutional: Negative for fatigue and fever.  HENT: Negative for congestion and sore throat.   Eyes: Negative for photophobia and visual disturbance.  Respiratory: Negative for cough, choking, chest tightness and shortness of breath.   Cardiovascular: Negative for chest pain.  Gastrointestinal: Positive for abdominal pain. Negative for constipation, diarrhea, nausea and vomiting.  Genitourinary: Positive for vaginal bleeding. Negative for  dysuria, vaginal discharge and vaginal pain.  Musculoskeletal: Negative for back pain.  Neurological: Negative for headaches.   I have reviewed patient's Past Medical Hx, Surgical Hx, Family Hx, Social Hx, medications and allergies.   Physical Exam   Patient Vitals for the past 24 hrs:  BP Temp Temp src Pulse Resp SpO2 Height Weight  10/18/20 1110 117/79 98.6 F (37 C) Oral 93 16 100 % -- --  10/18/20 1107 -- -- -- -- -- -- 5\' 6"  (1.676 m) 86.6 kg   Constitutional: Well-developed, well-nourished female in no acute distress. Tearful. Cardiovascular: normal rate Respiratory: normal effort GI: Abd soft, mild left-sided lower abdominal tenderness.  MS: Extremities nontender, no edema, normal ROM Neurologic: Alert and oriented x 4.  GU: Neg CVAT.  PELVIC EXAM: Cervix pink, slightly open with scant bleeding visible from inside the cervical os, vaginal walls and external genitalia normal. Bimanual exam: deferred.  Unable to find FHTs on bedside Doppler.  LAB RESULTS Results for orders placed or performed during the hospital encounter of 10/18/20 (from the past 24 hour(s))  Pregnancy, urine POC     Status: Abnormal   Collection Time: 10/18/20 11:01 AM  Result Value Ref Range   Preg Test, Ur POSITIVE (A) NEGATIVE  Urinalysis, Routine w reflex microscopic Urine, Clean Catch     Status: Abnormal   Collection Time: 10/18/20 11:07 AM  Result Value Ref Range   Color, Urine YELLOW YELLOW   APPearance CLOUDY (A) CLEAR   Specific Gravity, Urine 1.013 1.005 - 1.030   pH 7.0 5.0 - 8.0   Glucose, UA NEGATIVE NEGATIVE mg/dL   Hgb urine dipstick NEGATIVE NEGATIVE   Bilirubin Urine NEGATIVE NEGATIVE   Ketones, ur NEGATIVE NEGATIVE mg/dL   Protein, ur NEGATIVE NEGATIVE mg/dL   Nitrite NEGATIVE NEGATIVE   Leukocytes,Ua NEGATIVE NEGATIVE  Wet prep, genital     Status: Abnormal   Collection Time: 10/18/20 12:26 PM   Specimen: PATH Cytology Cervicovaginal Ancillary Only  Result Value Ref Range    Yeast Wet Prep HPF POC NONE SEEN NONE SEEN   Trich, Wet Prep NONE SEEN NONE SEEN   Clue Cells Wet Prep HPF POC PRESENT (A) NONE SEEN   WBC, Wet Prep HPF POC MANY (A) NONE SEEN   Sperm NONE SEEN     --/--/O POS (07/02 1025)  IMAGING 03-20-1986 OB Comp Less 14 Wks  Result Date: 10/18/2020 CLINICAL DATA:  Vaginal bleeding, lower abdominal pain. Gestational age by last menstrual period is 11 weeks 1 day. EXAM: OBSTETRIC <14 WK ULTRASOUND TECHNIQUE: Transabdominal ultrasound was performed for evaluation of the gestation as well as the maternal uterus and adnexal regions. COMPARISON:  Obstetric ultrasound dated 05/13/2020. FINDINGS: Intrauterine gestational sac: Single Yolk sac:  Not Visualized. Embryo:  Visualized. Cardiac Activity: Not Visualized. CRL:   22.8 mm  8 w 6 d                  [place] EDC: 05/24/2021 Subchorionic hemorrhage: Small subchorionic hemorrhage is visualized. Maternal uterus/adnexae: Normal appearing. No free fluid. IMPRESSION: No evidence of fetal cardiac activity on M-mode, grey scale cine and color Doppler, diagnostic of intrauterine fetal demise. These results were called by telephone at the time of interpretation on 10/18/2020 at 1:16 pm to provider Lynnda Shields, who verbally acknowledged these results. Electronically Signed   By: Romona Curls M.D.   On: 10/18/2020 13:18    MAU Management/MDM: Orders Placed This Encounter  Procedures  . Wet prep, genital  . Respiratory Panel by RT PCR (Flu A&B, Covid) - Nasopharyngeal Swab  . US OB Comp Less 14 Wks  . Urinalysis, Routine w reflex microscopic Urine, Clean Catch  . CBC  . Diet NPO time specified  . Place and maintain sequential compression device  . Pregnancy, urine POC  . Type and screen [facility]  . Insert peripheral IV    Meds ordered this encounter  Medications  . acetaminophen (TYLENOL) tablet 1,000 mg  . lactated ringers bolus 1,000 mL  . lactated ringers infusion  . sodium citrate-citric acid  (ORACIT) solution 30 mL  . famotidine (PEPCID) IVPB 20 mg premix  . doxycycline (VIBRAMYCIN) 200 mg in dextrose 5 % 250 mL IVPB    Order Specific Question:   Antibiotic Indication:    Answer:   Other Indication (list below)    Order Specific Question:   Other Indication:    Answer:   Suction D&C, perioperative    Consult with Dr. Timothy Lasso (on call provider for [facility]).  Treatments in MAU included tylenol.  Pt sent to ultrasound given minimal bleeding visible from inside cervical os and inability to obtain FHTs with bedside Doppler.  Spoke with radiologist (Dr. De Nurse) who confirmed no fetal cardiac activity on transvaginal ultrasound.  1330: Discussed [place] results with patient (GA [redacted]w[redacted]d and absent FHTs consistent with intrauterine fetal demise). S/p conversation regarding management options, pt strongly desires a D&E as soon as possible. Last po intake (chicken biscit) at 0800 today.  1345: Discussed patient with Dr. Timothy Lasso (on call provider for [facility]). Dr. Timothy Lasso will call Main OR to get patient scheduled for D&E today.   ASSESSMENT 1. Spontaneous miscarriage   2. Vaginal bleeding in pregnancy, first trimester    PLAN Admission by Dr. Timothy Lasso (on call provider for [facility]) for D&E today. NPO ordered placed. Plan for labs and COVID swab in prep for OR.  Sheila Oats, MD OB Fellow, Faculty Practice 10/18/2020 2:39 PM

## 2020-10-18 NOTE — Op Note (Addendum)
Procedure(s): DILATATION AND EVACUATION Procedure Note  Joanna Mcpherson female 34 y.o. 10/18/2020  Procedure(s) and Anesthesia Type:    * DILATATION AND EVACUATION - General  Surgeon(s) and Role:    * Charlett Nose, MD - Primary   Indications: Missed abortion at [redacted]w[redacted]d      Surgeon: Charlett Nose   Assistants:None  Anesthesia: General endotracheal anesthesia  DILATATION AND EVACUATION  Findings: Products of conception  Antibiotics: Doxycycline 200mg    Estimated Blood Loss:  less than 50 mL         Drains: none  Blood Given: none          Specimens: Products of conception        Complications:  None         Disposition: PACU - hemodynamically stable.         Condition: stable  Procedure Detail: Joanna Mcpherson is a 34 year old G7 P85 diagnosed with a missed abortion at approximately 8 weeks 6 days.  After discussion of management options she elected to proceed with suction dilation and curettage.  She was appropriately consented and taken to the operating room where general anesthesia was administered. She was placed in the dorsal lithotomy position in stirrups. She was prepared and draped in normal sterile fashion. P12 of urine was emptied via straight catheter. Time out was completed prior to starting the procedure. A speculum was inserted into the vagina and cervix was visualized.  The anterior lip of the cervix was grasped with a single-tooth tenaculum.  The cervix was a dilated with Pratt's dilators to accommodate a 9 mm suction curette.The suction curette was advanced to the uterine fundus. The suction was then started. The products of conception were evacuated with the curette rotating on outward movement for a total of four passes. A sharp gentle curettage was then performed. Two final passes with the suction curette removed the remaining debris. The tenaculum was removed from the cervix and hemostasis was noted at the puncture sites. Good hemostasis was  noted at the cervical os. The patient tolerated the procedure well. Counts were correct times two. The patient was awakened and taken to the recovery room in stable condition. Her blood type is O positive and she will not need Rhogam prior to discharge.

## 2020-10-18 NOTE — MAU Note (Signed)
Joanna Mcpherson is a 34 y.o. at [redacted]w[redacted]d here in MAU reporting: since yesterday around 1500 when she went to the bathroom starting some pink discharge. Today is pink and brown. Denies itching, states there is a slight odor. Also amount of discharge is increased a little bit. No pain.  Has been to the office and has had an ultrasound, reports everything looked good.  LMP: 08/01/20  Onset of complaint: yesterday  Pain score: 0/10  Vitals:   10/18/20 1110  BP: 117/79  Pulse: 93  Resp: 16  Temp: 98.6 F (37 C)  SpO2: 100%     Lab orders placed from triage: UA UPT

## 2020-10-18 NOTE — Anesthesia Preprocedure Evaluation (Signed)
Anesthesia Evaluation  Patient identified by MRN, date of birth, ID band Patient awake    Reviewed: Allergy & Precautions, NPO status , Patient's Chart, lab work & pertinent test results  Airway Mallampati: I  TM Distance: >3 FB Neck ROM: Full    Dental   Pulmonary asthma , former smoker,    Pulmonary exam normal        Cardiovascular Normal cardiovascular exam     Neuro/Psych    GI/Hepatic GERD  Medicated and Controlled,  Endo/Other    Renal/GU      Musculoskeletal   Abdominal   Peds  Hematology   Anesthesia Other Findings   Reproductive/Obstetrics                             Anesthesia Physical Anesthesia Plan  ASA: II and emergent  Anesthesia Plan: General   Post-op Pain Management:    Induction: Intravenous  PONV Risk Score and Plan: 3 and Ondansetron, Midazolam and Treatment may vary due to age or medical condition  Airway Management Planned: LMA  Additional Equipment:   Intra-op Plan:   Post-operative Plan: Extubation in OR  Informed Consent: I have reviewed the patients History and Physical, chart, labs and discussed the procedure including the risks, benefits and alternatives for the proposed anesthesia with the patient or authorized representative who has indicated his/her understanding and acceptance.       Plan Discussed with: CRNA and Surgeon  Anesthesia Plan Comments:         Anesthesia Quick Evaluation

## 2020-10-19 ENCOUNTER — Encounter (HOSPITAL_COMMUNITY): Payer: Self-pay | Admitting: Obstetrics and Gynecology

## 2020-10-19 LAB — GC/CHLAMYDIA PROBE AMP (~~LOC~~) NOT AT ARMC
Chlamydia: NEGATIVE
Comment: NEGATIVE
Comment: NORMAL
Neisseria Gonorrhea: NEGATIVE

## 2020-10-20 LAB — SURGICAL PATHOLOGY

## 2020-12-19 NOTE — L&D Delivery Note (Signed)
Delivery Note Pt labored well to complete with uncontrollable urge to push. She pushed for 15-7mins and at 11:50 AM a viable female was delivered via Vaginal, Spontaneous (Presentation: Left Occiput Anterior).  APGAR: 8, 9; weight pending  Anterior and posterior shoulders delivered in McRoberts position. Body easily followed. Cord was clamped and cut. Cord blood obtained.  Placenta status: delivered, shultz; intact ,  .  Cord: 3 vessels with the following complications: None.  Cord pH: n/a  Anesthesia: None Episiotomy: None Lacerations:  none Suture Repair:  n/a Est. Blood Loss (mL): 150  Mom to postpartum.  Baby to Couplet care / Skin to Skin.  Cathrine Muster 11/26/2021, 12:12 PM

## 2021-04-20 LAB — OB RESULTS CONSOLE RUBELLA ANTIBODY, IGM: Rubella: IMMUNE

## 2021-04-20 LAB — OB RESULTS CONSOLE HEPATITIS B SURFACE ANTIGEN: Hepatitis B Surface Ag: NEGATIVE

## 2021-04-20 LAB — OB RESULTS CONSOLE HIV ANTIBODY (ROUTINE TESTING): HIV: NONREACTIVE

## 2021-05-03 IMAGING — US US OB COMP LESS 14 WK
2 series · 14 of 16 positions shown · non-contrast
Comparison: None.

CLINICAL DATA: Pelvic pain and heavy vaginal bleeding in 1st
trimester pregnancy. Gestational age by LMP of level weeks 0 days.

EXAM:
OBSTETRIC <14 WK ULTRASOUND
TECHNIQUE: Transabdominal ultrasound was performed for evaluation of the
gestation as well as the maternal uterus and adnexal regions.
Transvaginal sonography could not be performed due to heavy vaginal
bleeding at time of this exam.

[Series 1: us ob comp less 14 wk · 12 of 14 slices shown (1 of 2)]
[im 1/14]
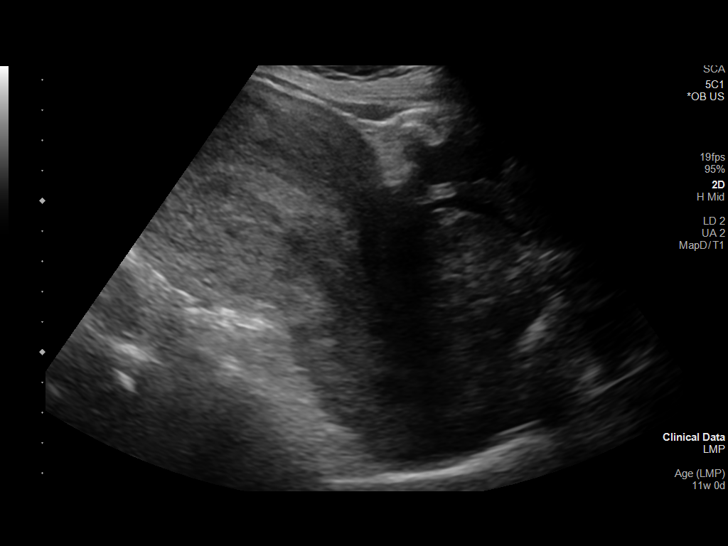
[im 2/14]
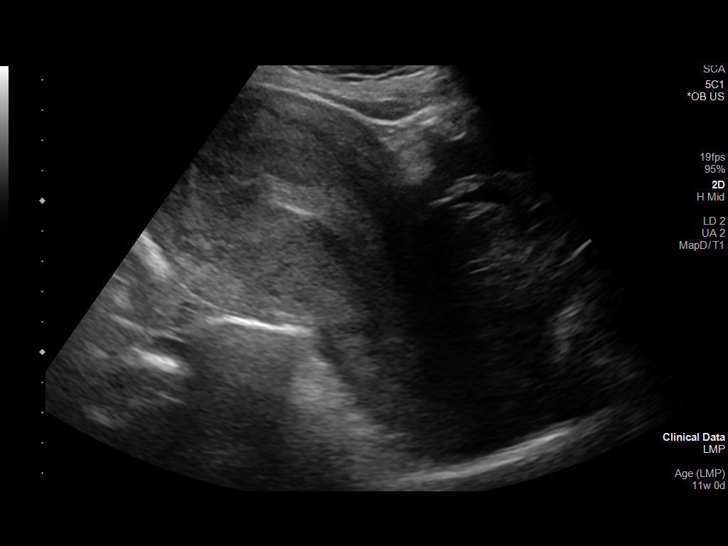
[im 3/14]
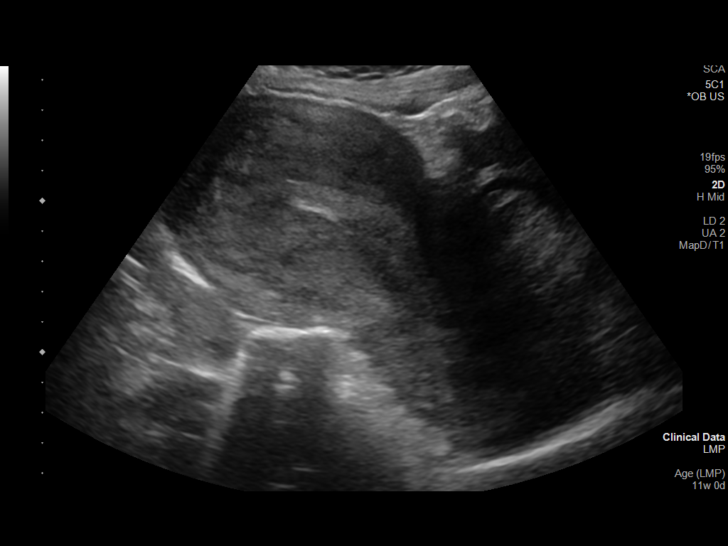
[im 5/14]
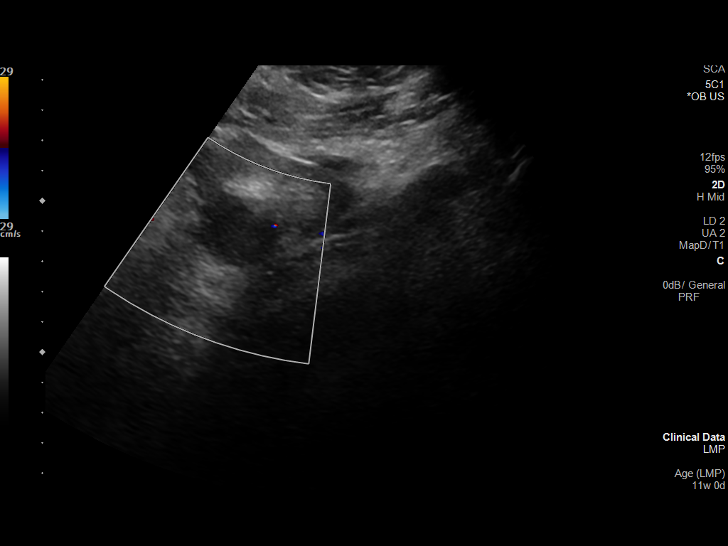
[im 6/14]
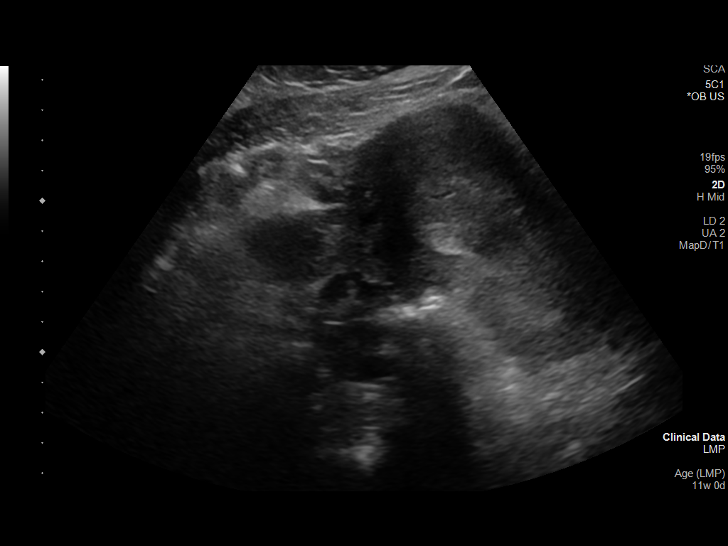
[im 7/14]
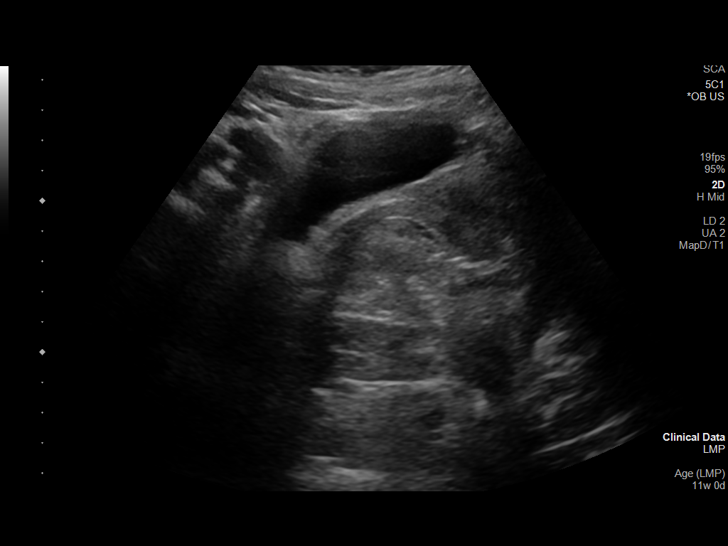
[im 8/14]
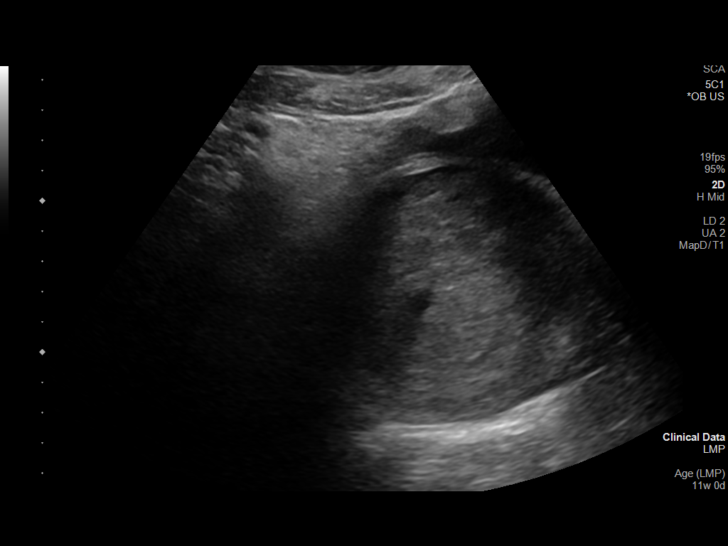
[im 9/14]
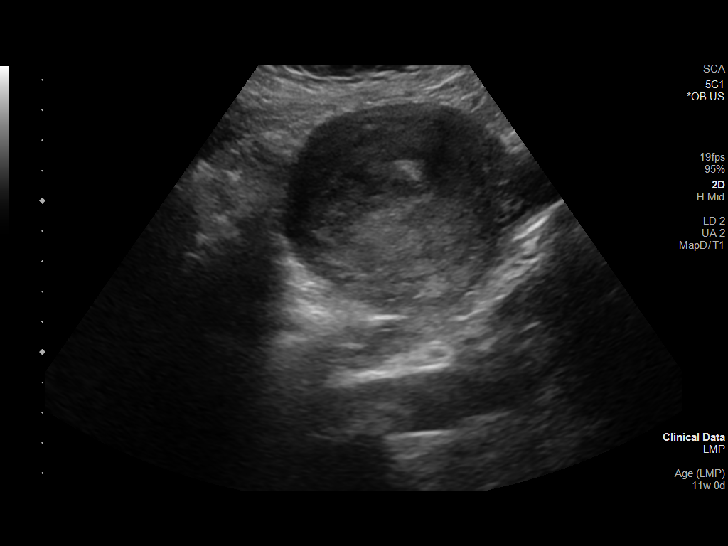
[im 10/14]
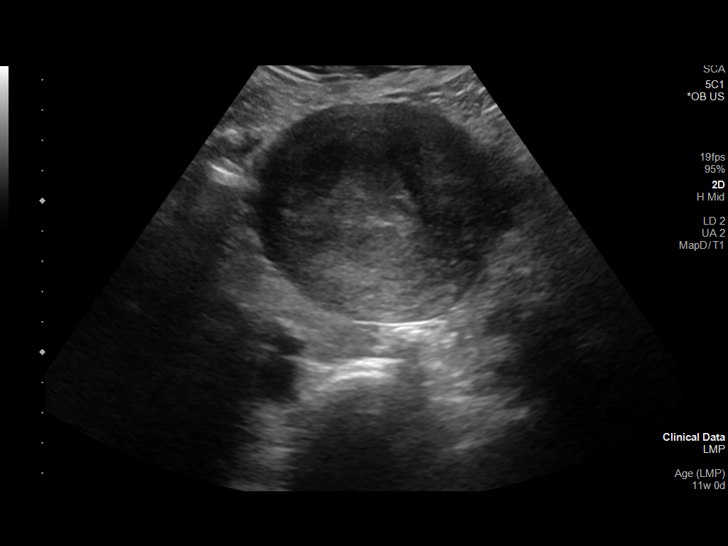
[im 11/14]
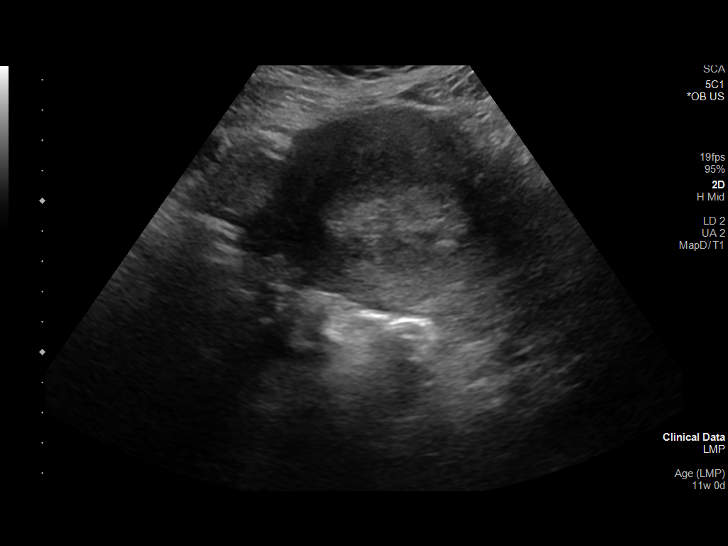
[im 13/14]
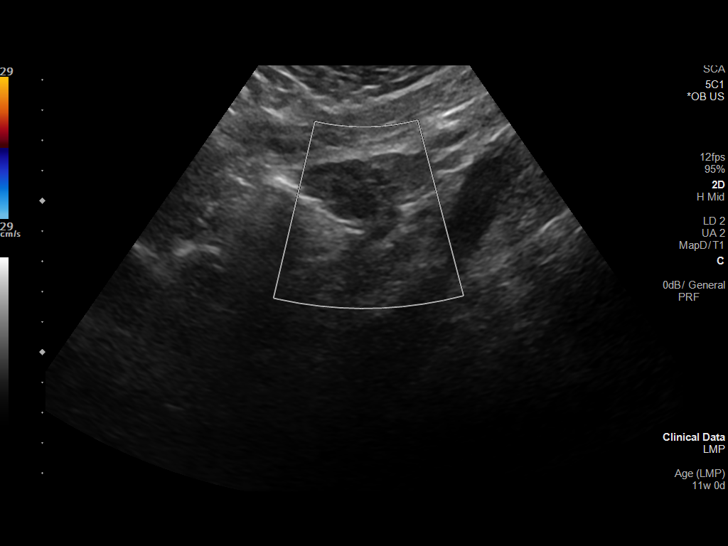
[im 14/14]
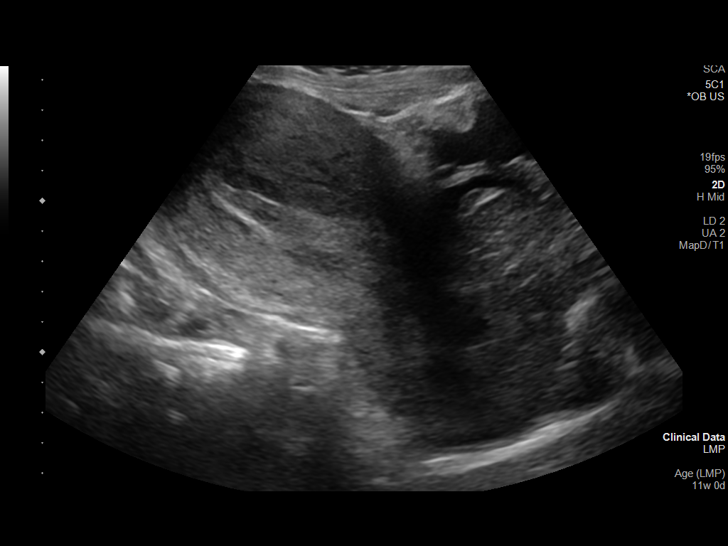

[Series 2: us ob comp less 14 wk · 2 of 2 slices shown (2 of 2)]
[im 1/2]
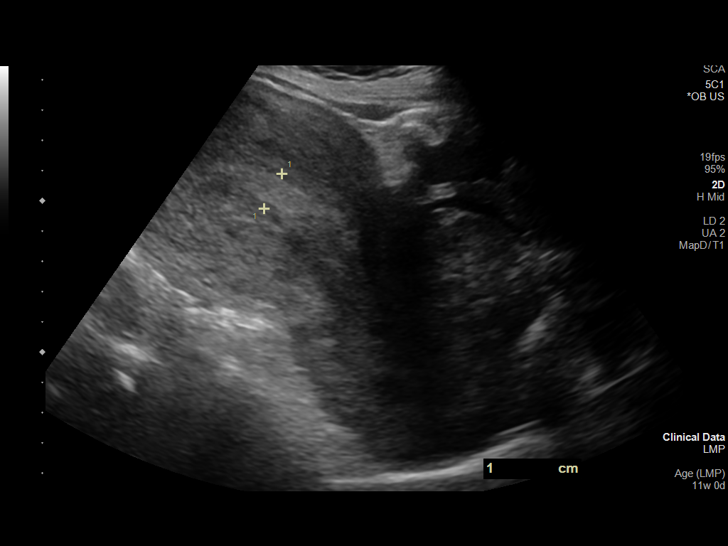
[im 2/2]
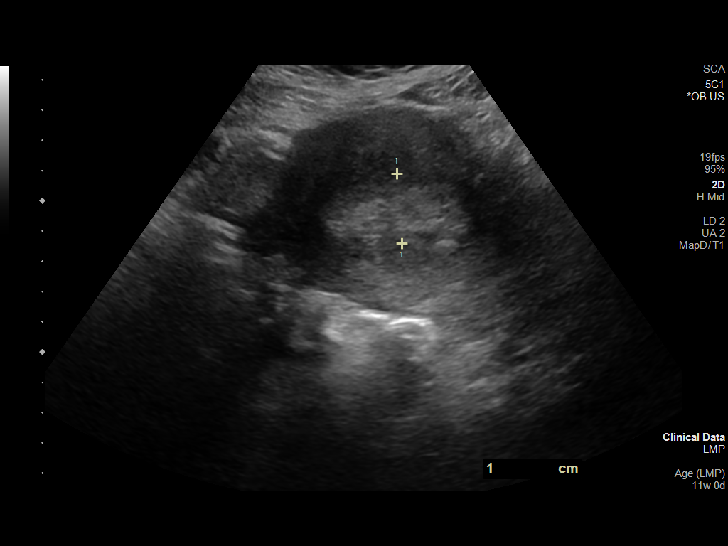

[14 of 16 positions shown; findings below may reference images not displayed]

FINDINGS: Intrauterine gestational sac: None

Maternal uterus/adnexae: Endometrium shows heterogeneous echotexture
and measures 23 mm in thickness. No fibroids identified. Both
ovaries are normal in appearance. No adnexal mass or abnormal free
fluid identified.
IMPRESSION: Pregnancy of unknown anatomic location (no intrauterine gestational
sac or adnexal mass identified). Differential diagnosis includes
recent spontaneous abortion, IUP too early to visualize, and
non-visualized ectopic pregnancy. Recommend correlation with serial
beta-hCG levels, and follow up US if warranted clinically.

## 2021-05-23 ENCOUNTER — Other Ambulatory Visit: Payer: Self-pay

## 2021-05-23 ENCOUNTER — Inpatient Hospital Stay (HOSPITAL_COMMUNITY)
Admission: AD | Admit: 2021-05-23 | Discharge: 2021-05-23 | Disposition: A | Discharge status: Home / Self Care | Payer: Medicaid Other | Attending: Obstetrics | Admitting: Obstetrics

## 2021-05-23 ENCOUNTER — Encounter (HOSPITAL_COMMUNITY): Payer: Self-pay | Admitting: Obstetrics

## 2021-05-23 DIAGNOSIS — R519 Headache, unspecified: Secondary | ICD-10-CM | POA: Diagnosis not present

## 2021-05-23 DIAGNOSIS — Z87891 Personal history of nicotine dependence: Secondary | ICD-10-CM | POA: Insufficient documentation

## 2021-05-23 DIAGNOSIS — Z79899 Other long term (current) drug therapy: Secondary | ICD-10-CM | POA: Insufficient documentation

## 2021-05-23 DIAGNOSIS — O219 Vomiting of pregnancy, unspecified: Secondary | ICD-10-CM | POA: Insufficient documentation

## 2021-05-23 DIAGNOSIS — Z3A11 11 weeks gestation of pregnancy: Secondary | ICD-10-CM | POA: Diagnosis not present

## 2021-05-23 DIAGNOSIS — O26891 Other specified pregnancy related conditions, first trimester: Secondary | ICD-10-CM | POA: Insufficient documentation

## 2021-05-23 LAB — URINALYSIS, ROUTINE W REFLEX MICROSCOPIC
Bilirubin Urine: NEGATIVE
Glucose, UA: NEGATIVE mg/dL
Ketones, ur: 80 mg/dL — AB
Nitrite: NEGATIVE
Protein, ur: 30 mg/dL — AB
Specific Gravity, Urine: 1.026 (ref 1.005–1.030)
pH: 6 (ref 5.0–8.0)

## 2021-05-23 LAB — POCT PREGNANCY, URINE: Preg Test, Ur: POSITIVE — AB

## 2021-05-23 MED ORDER — LACTATED RINGERS IV BOLUS
1000.0000 mL | Freq: Once | INTRAVENOUS | Status: AC
  Administered 2021-05-23: 1000 mL via INTRAVENOUS

## 2021-05-23 MED ORDER — PROMETHAZINE HCL 25 MG PO TABS
12.5000 mg | ORAL_TABLET | Freq: Four times a day (QID) | ORAL | 0 refills | Status: DC | PRN
Start: 2021-05-23 — End: 2021-11-02

## 2021-05-23 MED ORDER — SODIUM CHLORIDE 0.9 % IV SOLN
25.0000 mg | Freq: Once | INTRAVENOUS | Status: AC
  Administered 2021-05-23: 25 mg via INTRAVENOUS
  Filled 2021-05-23: qty 1

## 2021-05-23 NOTE — Discharge Instructions (Signed)
Morning Sickness  Morning sickness is when a woman feels nauseous during pregnancy. This nauseous feeling may or may not come with vomiting. It often occurs in the morning, but it can be a problem at any time of day. Morning sickness is most common during the first trimester. In some cases, it may continue throughout pregnancy. Although morning sickness is unpleasant, it is usually harmless unless the woman develops severe and continual vomiting (hyperemesis gravidarum), a condition that requires more intense treatment. What are the causes? The exact cause of this condition is not known, but it seems to be related to normal hormonal changes that occur in pregnancy. What increases the risk? You are more likely to develop this condition if:  You experienced nausea or vomiting before your pregnancy.  You had morning sickness during a previous pregnancy.  You are pregnant with more than one baby, such as twins. What are the signs or symptoms? Symptoms of this condition include:  Nausea.  Vomiting. How is this diagnosed? This condition is usually diagnosed based on your signs and symptoms. How is this treated? In many cases, treatment is not needed for this condition. Making some changes to what you eat may help to control symptoms. Your health care provider may also prescribe or recommend:  Vitamin B6 supplements.  Anti-nausea medicines.  Ginger. Follow these instructions at home: Medicines  Take over-the-counter and prescription medicines only as told by your health care provider. Do not use any prescription, over-the-counter, or herbal medicines for morning sickness without first talking with your health care provider.  Take multivitamins before getting pregnant. This can prevent or decrease the severity of morning sickness in most women. Eating and drinking  Eat a piece of dry toast or crackers before getting out of bed in the morning.  Eat 5 or 6 small meals a day.  Eat dry  and bland foods, such as rice or a baked potato. Foods that are high in carbohydrates are often helpful.  Avoid greasy, fatty, and spicy foods.  Have someone cook for you if the smell of any food causes nausea and vomiting.  If you feel nauseous after taking prenatal vitamins, take the vitamins at night or with a snack.  Eat a protein snack between meals if you are hungry. Nuts, yogurt, and cheese are good options.  Drink fluids throughout the day.  Try ginger ale made with real ginger, ginger tea made from fresh grated ginger, or ginger candies. General instructions  Do not use any products that contain nicotine or tobacco. These products include cigarettes, chewing tobacco, and vaping devices, such as e-cigarettes. If you need help quitting, ask your health care provider.  Get an air purifier to keep the air in your house free of odors.  Get plenty of fresh air.  Try to avoid odors that trigger your nausea.  Consider trying these methods to help relieve symptoms: ? Wearing an acupressure wristband. These wristbands are often worn for seasickness. ? Acupuncture. Contact a health care provider if:  Your home remedies are not working and you need medicine.  You feel dizzy or light-headed.  You are losing weight. Get help right away if:  You have persistent and uncontrolled nausea and vomiting.  You faint.  You have severe pain in your abdomen. Summary  Morning sickness is when a woman feels nauseous during pregnancy. This nauseous feeling may or may not come with vomiting.  Morning sickness is most common during the first trimester.  It often occurs in the   morning, but it can be a problem at any time of day.  In many cases, treatment is not needed for this condition. Making some changes to what you eat may help to control symptoms. This information is not intended to replace advice given to you by your health care provider. Make sure you discuss any questions you have  with your health care provider. Document Revised: 07/20/2020 Document Reviewed: 06/29/2020 Elsevier Patient Education  2021 Elsevier Inc.  

## 2021-05-23 NOTE — MAU Note (Signed)
Pt reports headache since yesterday, nausea and vomiting all day. LMP 03/01/2021

## 2021-05-23 NOTE — MAU Provider Note (Signed)
History     CSN: [id number]  Arrival date and time: 05/23/21 4268   Event Date/Time   First Provider Initiated Contact with Patient 05/23/21 0355      Chief Complaint  Patient presents with  . Headache  . Emesis  . Nausea   Joanna Mcpherson is a 35 y.o. [id number] at 73w6dwho presents today with nausea and vomiting. She has had this her entire pregnancy, but over the last day it has been worse. She reports that she does not have any antiemetics. She felt like she was doing ok and didn't need any.   Headache  Associated symptoms include vomiting.  Emesis  This is a new problem. The current episode started more than 1 month ago. The problem occurs 2 to 4 times per day. The problem has been gradually worsening. The emesis has an appearance of stomach contents. There has been no fever. Associated symptoms include headaches. Risk factors: pregnancy  She has tried nothing for the symptoms.    OB History    Gravida  7   Para  3   Term  3   Preterm      AB  1   Living  3     SAB      IAB  1   Ectopic      Multiple  0   Live Births  3           Past Medical History:  Diagnosis Date  . Abnormal Pap smear   . Asthma    no issue in 30 yrs  . Headache(784.0)   . Hx of migraines last may 2021  . Miscarriage 04/2020    Past Surgical History:  Procedure Laterality Date  . ANKLE FRACTURE SURGERY Left 14 yrs ago  . CERVICAL CONE BIOPSY    . DILATION AND EVACUATION N/A 10/18/2020   Procedure: DILATATION AND EVACUATION;  Surgeon: MRowland Lathe MD;  [facility]  Service: Gynecology;  Laterality: N/A;  . elective abortion    . HYSTEROSCOPY WITH D & C N/A 06/19/2020   Procedure: DILATATION AND CURETTAGE /HYSTEROSCOPY WITH HYSTEROSCOPIC MYOMECTOMY;  Surgeon: RVanessa Kick MD;  [facility]  Service: [facility];  Laterality: N/A;    Family History  Problem Relation Age of Onset  . Diabetes Mother     Social History   Tobacco Use   . Smoking status: Former Smoker    Packs/day: 0.50    Years: 6.00    Pack years: 3.00    Types: Cigarettes    Quit date: 12/19/2017    Years since quitting: 3.4  . Smokeless tobacco: Never Used  Vaping Use  . Vaping Use: Never used  Substance Use Topics  . Alcohol use: Not Currently    Alcohol/week: 0.0 standard drinks  . Drug use: No    Allergies: No Known Allergies  Medications Prior to Admission  Medication Sig Dispense Refill Last Dose  . miconazole (MONISTAT 1 COMBINATION PACK) kit Place 1 each vaginally once.   05/22/2021 at Unknown time  . Prenatal Vit-Fe Fumarate-FA (MULTIVITAMIN-PRENATAL) 27-0.8 MG TABS tablet Take 1 tablet by mouth daily at 12 noon.   05/22/2021 at Unknown time  . ibuprofen (ADVIL) 600 MG tablet Take 1 tablet (600 mg total) by mouth every 6 (six) hours as needed. 30 tablet 0     Review of Systems  Gastrointestinal: Positive for vomiting.  Neurological: Positive for headaches.   Physical Exam   Blood pressure 102/61,  pulse (!) 101, temperature (P) 99.3 F (37.4 C), temperature source (P) Oral, resp. rate 17, last menstrual period 03/01/2021, SpO2 98 %, unknown if currently breastfeeding.  Physical Exam Vitals and nursing note reviewed.  Constitutional:      General: She is not in acute distress. HENT:     Head: Normocephalic.  Eyes:     Pupils: Pupils are equal, round, and reactive to light.  Cardiovascular:     Rate and Rhythm: Normal rate.  Abdominal:     General: Abdomen is flat.     Tenderness: There is no abdominal tenderness.  Skin:    General: Skin is warm and dry.  Neurological:     Mental Status: She is alert and oriented to person, place, and time.  Psychiatric:        Mood and Affect: Mood normal.        Behavior: Behavior normal.     Results for orders placed or performed during the hospital encounter of 05/23/21 (from the past 24 hour(s))  Pregnancy, urine POC     Status: Abnormal   Collection Time: 05/23/21  3:11 AM   Result Value Ref Range   Preg Test, Ur POSITIVE (A) NEGATIVE  Urinalysis, Routine w reflex microscopic Urine, Clean Catch     Status: Abnormal   Collection Time: 05/23/21  3:13 AM  Result Value Ref Range   Color, Urine AMBER (A) YELLOW   APPearance CLOUDY (A) CLEAR   Specific Gravity, Urine 1.026 1.005 - 1.030   pH 6.0 5.0 - 8.0   Glucose, UA NEGATIVE NEGATIVE mg/dL   Hgb urine dipstick SMALL (A) NEGATIVE   Bilirubin Urine NEGATIVE NEGATIVE   Ketones, ur 80 (A) NEGATIVE mg/dL   Protein, ur 30 (A) NEGATIVE mg/dL   Nitrite NEGATIVE NEGATIVE   Leukocytes,Ua MODERATE (A) NEGATIVE   RBC / HPF 11-20 0 - 5 RBC/hpf   WBC, UA 0-5 0 - 5 WBC/hpf   Bacteria, UA RARE (A) NONE SEEN   Squamous Epithelial / LPF 11-20 0 - 5   Mucus PRESENT     MAU Course  Procedures  MDM Patient has had 1L LR bolus and 13m phenergan IV. She is feeling better and tolerating PO   Assessment and Plan   1. Nausea/vomiting in pregnancy   2. [redacted] weeks gestation of pregnancy    DC home Comfort measures reviewed  2ndTrimester precautions  RX: phenergan PRN #[id number]  Return to MAU as needed FU with OB as planned   Follow-up Information    [facility]   Contact information: [id number] HillDNP, CNM  05/23/21  5:02 AM

## 2021-06-30 ENCOUNTER — Other Ambulatory Visit: Payer: Self-pay

## 2021-06-30 ENCOUNTER — Encounter (HOSPITAL_COMMUNITY): Payer: Self-pay | Admitting: Obstetrics and Gynecology

## 2021-06-30 ENCOUNTER — Inpatient Hospital Stay (HOSPITAL_COMMUNITY)
Admission: AD | Admit: 2021-06-30 | Discharge: 2021-06-30 | Disposition: A | Discharge status: Home / Self Care | Payer: Medicaid Other | Attending: Obstetrics and Gynecology | Admitting: Obstetrics and Gynecology

## 2021-06-30 DIAGNOSIS — O26892 Other specified pregnancy related conditions, second trimester: Secondary | ICD-10-CM

## 2021-06-30 DIAGNOSIS — B9689 Other specified bacterial agents as the cause of diseases classified elsewhere: Secondary | ICD-10-CM | POA: Insufficient documentation

## 2021-06-30 DIAGNOSIS — O23592 Infection of other part of genital tract in pregnancy, second trimester: Secondary | ICD-10-CM | POA: Diagnosis not present

## 2021-06-30 DIAGNOSIS — N760 Acute vaginitis: Secondary | ICD-10-CM | POA: Diagnosis not present

## 2021-06-30 DIAGNOSIS — Z3A17 17 weeks gestation of pregnancy: Secondary | ICD-10-CM | POA: Diagnosis not present

## 2021-06-30 DIAGNOSIS — Z87891 Personal history of nicotine dependence: Secondary | ICD-10-CM | POA: Insufficient documentation

## 2021-06-30 LAB — WET PREP, GENITAL
Sperm: NONE SEEN
Trich, Wet Prep: NONE SEEN
Yeast Wet Prep HPF POC: NONE SEEN

## 2021-06-30 LAB — URINALYSIS, ROUTINE W REFLEX MICROSCOPIC
Bilirubin Urine: NEGATIVE
Glucose, UA: NEGATIVE mg/dL
Ketones, ur: NEGATIVE mg/dL
Nitrite: NEGATIVE
Protein, ur: NEGATIVE mg/dL
Specific Gravity, Urine: 1.006 (ref 1.005–1.030)
pH: 7 (ref 5.0–8.0)

## 2021-06-30 MED ORDER — METRONIDAZOLE 0.75 % VA GEL: 1.0000 | Freq: Every day | VAGINAL | 1 refills | Status: DC

## 2021-06-30 NOTE — MAU Note (Addendum)
Presents with c/o vaginal discharge and vaginal irritation and spotting.  States vaginal discharge has odor, not foul smelling. Reports used Monistat externally and vaginal irritation has worsened.  Also c/o spotting with wiping.

## 2021-06-30 NOTE — MAU Provider Note (Signed)
Chief Complaint:  Vaginal Discharge and Spotting   Event Date/Time   First Provider Initiated Contact with Patient 06/30/21 1712     HPI: Joanna Mcpherson is a 35 y.o. [id number] at 81w2dwho presents to maternity admissions reporting vaginal irritation and spotting. Patient reports she has had external vaginal irritation for the past several days. Was seen at her OB office recently and was told to soak in a warm tub and use Monistat. Patient reports today she tried to use the external Monistat cream and immediately "felt instant irritation and like everything was on fire". Around 3pm, she went to the bathroom and saw some spotting when she wiped which concerned her because she has a history of miscarriage. She denies any continued spotting or frank, bright red bleeding. She does endorse a thick, whitish/yellow snotty discharge with an odor. She denies abdominal/pelvic pain, urinary s/s. Denies recent intercourse.   Pregnancy Course:   Past Medical History:  Diagnosis Date  . Abnormal Pap smear   . Asthma    no issue in 30 yrs  . Headache(784.0)   . Hx of migraines last may 2021  . Miscarriage 04/2020   OB History  Gravida Para Term Preterm AB Living  _0 SAB IAB Ectopic Multiple Live Births    1   0 3    # Outcome Date GA Lbr Len/2nd Weight Sex Delivery Anes PTL Lv  7 Current           6 Term 08/15/19 471w3d 00:12 3090 g F Vag-Spont EPI  LIV     Birth Comments: WNL   5 Term 04/21/13 3935w0d:48 / 00:23  M Vag-Spont EPI  LIV     Birth Comments: none  4 Gravida           3 Gravida           2 Term     M Vag-Spont   LIV  1 IAB            Past Surgical History:  Procedure Laterality Date  . ANKLE FRACTURE SURGERY Left 14 yrs ago  . CERVICAL CONE BIOPSY    . DILATION AND EVACUATION N/A 10/18/2020   Procedure: DILATATION AND EVACUATION;  Surgeon: MarRowland LatheD;  [facility]  Laterality: N/A;  . elective abortion    . HYSTEROSCOPY WITH D  & C N/A 06/19/2020   Procedure: DILATATION AND CURETTAGE /HYSTEROSCOPY WITH HYSTEROSCOPIC MYOMECTOMY;  Surgeon: RosVanessa KickD;  [facility]  Laterality: N/A;   Family History  Problem Relation Age of Onset  . Diabetes Mother    Social History   Tobacco Use  . Smoking status: Former    Packs/day: 0.50    Years: 6.00    Pack years: 3.00    Types: Cigarettes    Quit date: 12/19/2017    Years since quitting: 3.5  . Smokeless tobacco: Never  Vaping Use  . Vaping Use: Never used  Substance Use Topics  . Alcohol use: Not Currently    Alcohol/week: 0.0 standard drinks  . Drug use: No   No Known Allergies Medications Prior to Admission  Medication Sig Dispense Refill Last Dose  . Prenatal Vit-Fe Fumarate-FA (MULTIVITAMIN-PRENATAL) 27-0.8 MG TABS tablet Take 1 tablet by mouth daily at 12 noon.   06/30/2021  . miconazole (MONISTAT 1 COMBINATION PACK) kit Place 1 each vaginally once.     .Marland Kitchen  promethazine (PHENERGAN) 25 MG tablet Take 0.5-1 tablets (12.5-25 mg total) by mouth every 6 (six) hours as needed for nausea or vomiting. 30 tablet 0    I have reviewed patient's Past Medical Hx, Surgical Hx, Family Hx, Social Hx, medications and allergies.   ROS:  Review of Systems  Constitutional: Negative.   Respiratory: Negative.    Cardiovascular: Negative.   Gastrointestinal: Negative.   Genitourinary:  Positive for vaginal discharge.       Spotting, itching/irritation   Musculoskeletal: Negative.   Neurological: Negative.   Psychiatric/Behavioral: Negative.     Physical Exam  Patient Vitals for the past 24 hrs:  BP Temp Temp src Pulse Resp SpO2 Height Weight  06/30/21 1656 112/65 98.1 F (36.7 C) Oral 87 19 100 % -- --  06/30/21 1648 -- -- -- -- -- -- 5' 6" (1.676 m) 87.9 kg   Constitutional: well-developed, well-nourished female in no acute distress.  Cardiovascular: normal rate Respiratory: normal effort GI: abd soft, non-tender,  gravid  MS: extremities nontender, no edema, normal ROM Neurologic: alert and oriented x 4.  GU: neg CVAT. Pelvic: External genitalia appears slightly erythematous and inflammed, no lesions, small amount of thin white discharge, no blood, cervix clean without lesions/masses, visually closed, no CMT Cervix: closed/thick  FHT: 167 bpm via doppler   Labs: Results for orders placed or performed during the hospital encounter of 06/30/21 (from the past 24 hour(s))  Wet prep, genital     Status: Abnormal   Collection Time: 06/30/21  5:21 PM   Specimen: Vaginal  Result Value Ref Range   Yeast Wet Prep HPF POC NONE SEEN NONE SEEN   Trich, Wet Prep NONE SEEN NONE SEEN   Clue Cells Wet Prep HPF POC PRESENT (A) NONE SEEN   WBC, Wet Prep HPF POC MANY (A) NONE SEEN   Sperm NONE SEEN   Urinalysis, Routine w reflex microscopic Urine, Clean Catch     Status: Abnormal   Collection Time: 06/30/21  5:32 PM  Result Value Ref Range   Color, Urine STRAW (A) YELLOW   APPearance CLEAR CLEAR   Specific Gravity, Urine 1.006 1.005 - 1.030   pH 7.0 5.0 - 8.0   Glucose, UA NEGATIVE NEGATIVE mg/dL   Hgb urine dipstick SMALL (A) NEGATIVE   Bilirubin Urine NEGATIVE NEGATIVE   Ketones, ur NEGATIVE NEGATIVE mg/dL   Protein, ur NEGATIVE NEGATIVE mg/dL   Nitrite NEGATIVE NEGATIVE   Leukocytes,Ua TRACE (A) NEGATIVE   RBC / HPF 0-5 0 - 5 RBC/hpf   WBC, UA 0-5 0 - 5 WBC/hpf   Bacteria, UA RARE (A) NONE SEEN   Squamous Epithelial / LPF 0-5 0 - 5    Imaging:  No results found.  MAU Course: Orders Placed This Encounter  Procedures  . Wet prep, genital  . OB Urine Culture  . Urinalysis, Routine w reflex microscopic Urine, Clean Catch  . Discharge patient   Meds ordered this encounter  Medications  . metroNIDAZOLE (METROGEL) 0.75 % vaginal gel    Sig: Place 1 Applicatorful vaginally at bedtime. Apply one applicatorful to vagina at bedtime for 7 days    Dispense:  70 g    Refill:  1    Order Specific  Question:   Supervising Provider    Answer:   Merrily Pew     MDM: UA, will send urine culture Wet prep + clue cells Speculum exam with no blood Will treat for bacterial vaginosis. Rx for Metrogel sent  to patient's pharmacy   Assessment: 1. [redacted] weeks gestation of pregnancy   2. Bacterial vaginosis      Plan: Discharge home in stable condition.  Follow up as scheduled on 07/06/2021 at [facility] Return to MAU as needed for emergencies     Follow-up Information     Ob/Gyn, [facility] Follow up.   Why: as scheduled on Tuesday 07/06/2021. Contact information: [address] [place] [address] [phone]                 Allergies as of 06/30/2021   No Known Allergies      Medication List     STOP taking these medications    miconazole kit Commonly known as: MONISTAT 1 COMBINATION PACK       TAKE these medications    metroNIDAZOLE 0.75 % vaginal gel Commonly known as: METROGEL Place 1 Applicatorful vaginally at bedtime. Apply one applicatorful to vagina at bedtime for 7 days   multivitamin-prenatal 27-0.8 MG Tabs tablet Take 1 tablet by mouth daily at 12 noon.   promethazine 25 MG tablet Commonly known as: PHENERGAN Take 0.5-1 tablets (12.5-25 mg total) by mouth every 6 (six) hours as needed for nausea or vomiting.          Maryagnes Amos, MSN, CNM 06/30/2021 6:18 PM

## 2021-07-01 LAB — GC/CHLAMYDIA PROBE AMP (~~LOC~~) NOT AT ARMC
Chlamydia: NEGATIVE
Comment: NEGATIVE
Comment: NORMAL
Neisseria Gonorrhea: NEGATIVE

## 2021-07-02 LAB — CULTURE, OB URINE: Culture: 4000 — AB

## 2021-07-03 DIAGNOSIS — R8271 Bacteriuria: Secondary | ICD-10-CM | POA: Insufficient documentation

## 2021-09-05 ENCOUNTER — Other Ambulatory Visit: Payer: Self-pay

## 2021-09-05 ENCOUNTER — Encounter (HOSPITAL_COMMUNITY): Payer: Self-pay | Admitting: Obstetrics and Gynecology

## 2021-09-05 ENCOUNTER — Inpatient Hospital Stay (HOSPITAL_COMMUNITY)
Admission: AD | Admit: 2021-09-05 | Discharge: 2021-09-05 | Disposition: A | Discharge status: Home / Self Care | Payer: Medicaid Other | Attending: Obstetrics and Gynecology | Admitting: Obstetrics and Gynecology

## 2021-09-05 DIAGNOSIS — B3731 Acute candidiasis of vulva and vagina: Secondary | ICD-10-CM

## 2021-09-05 DIAGNOSIS — O98812 Other maternal infectious and parasitic diseases complicating pregnancy, second trimester: Secondary | ICD-10-CM | POA: Insufficient documentation

## 2021-09-05 DIAGNOSIS — Z87891 Personal history of nicotine dependence: Secondary | ICD-10-CM | POA: Diagnosis not present

## 2021-09-05 DIAGNOSIS — Z79899 Other long term (current) drug therapy: Secondary | ICD-10-CM | POA: Diagnosis not present

## 2021-09-05 DIAGNOSIS — Z3A26 26 weeks gestation of pregnancy: Secondary | ICD-10-CM | POA: Diagnosis not present

## 2021-09-05 DIAGNOSIS — O26892 Other specified pregnancy related conditions, second trimester: Secondary | ICD-10-CM | POA: Diagnosis not present

## 2021-09-05 DIAGNOSIS — B373 Candidiasis of vulva and vagina: Secondary | ICD-10-CM | POA: Diagnosis not present

## 2021-09-05 DIAGNOSIS — R102 Pelvic and perineal pain: Secondary | ICD-10-CM | POA: Insufficient documentation

## 2021-09-05 LAB — WET PREP, GENITAL
Clue Cells Wet Prep HPF POC: NONE SEEN
Sperm: NONE SEEN
Trich, Wet Prep: NONE SEEN

## 2021-09-05 MED ORDER — TERCONAZOLE 0.4 % VA CREA: 1.0000 | TOPICAL_CREAM | Freq: Every day | VAGINAL | 0 refills | Status: DC

## 2021-09-05 MED ORDER — LIDOCAINE HCL URETHRAL/MUCOSAL 2 % EX GEL
1.0000 "application " | Freq: Once | CUTANEOUS | Status: AC
  Administered 2021-09-05: 1 via TOPICAL
  Filled 2021-09-05: qty 6

## 2021-09-05 NOTE — MAU Note (Signed)
Joanna Mcpherson is a 35 y.o. at [redacted]w[redacted]d here in MAU reporting: increased discharge for the past few days, states it looks like snot. Also having some itching and feels like she is swollen. No bleeding or LOF. No pain. +FM  Onset of complaint: ongoing  Pain score: 0/10  Vitals:   09/05/21 1029  BP: 110/70  Pulse: 94  Resp: 16  Temp: 98.2 F (36.8 C)  SpO2: 98%     FHT:154  Lab orders placed from triage: none

## 2021-09-05 NOTE — Discharge Instructions (Signed)

## 2021-09-05 NOTE — MAU Provider Note (Signed)
History     CSN: [id number]  Arrival date and time: 09/05/21 1017   Event Date/Time   First Provider Initiated Contact with Patient 09/05/21 1116      Chief Complaint  Patient presents with   Vaginal Discharge   HPI Joanna Mcpherson is a 35 y.o. G7P3013 at [redacted]w[redacted]d who presents with vaginal pain and itching. She states she had a [place] wax on Wednesday and had sex that night. Since then, she feels like she has had swelling, itching and burning. She denies any bleeding or leaking. Reports normal fetal movement.  OB History     Gravida  7   Para  3   Term  3   Preterm      AB  1   Living  3      SAB      IAB  1   Ectopic      Multiple  0   Live Births  3           Past Medical History:  Diagnosis Date   Abnormal Pap smear    Asthma    no issue in 30 yrs   Headache(784.0)    Hx of migraines last may 2021   Miscarriage 04/2020    Past Surgical History:  Procedure Laterality Date   ANKLE FRACTURE SURGERY Left 14 yrs ago   CERVICAL CONE BIOPSY     DILATION AND EVACUATION N/A 10/18/2020   Procedure: DILATATION AND EVACUATION;  Surgeon: Charlett Nose, MD;  [facility];  Service: Gynecology;  Laterality: N/A;   elective abortion     HYSTEROSCOPY WITH D & C N/A 06/19/2020   Procedure: DILATATION AND CURETTAGE /HYSTEROSCOPY WITH HYSTEROSCOPIC MYOMECTOMY;  Surgeon: Waynard Reeds, MD;  Location: [facility];  Service: Gynecology;  Laterality: N/A;    Family History  Problem Relation Age of Onset   Diabetes Mother     Social History   Tobacco Use   Smoking status: Former    Packs/day: 0.50    Years: 6.00    Pack years: 3.00    Types: Cigarettes    Quit date: 12/19/2017    Years since quitting: 3.7   Smokeless tobacco: Never  Vaping Use   Vaping Use: Never used  Substance Use Topics   Alcohol use: Not Currently    Alcohol/week: 0.0 standard drinks   Drug use: No    Allergies: No Known Allergies  Medications Prior to  Admission  Medication Sig Dispense Refill Last Dose   Prenatal Vit-Fe Fumarate-FA (MULTIVITAMIN-PRENATAL) 27-0.8 MG TABS tablet Take 1 tablet by mouth daily at 12 noon.   09/04/2021   metroNIDAZOLE (METROGEL) 0.75 % vaginal gel Place 1 Applicatorful vaginally at bedtime. Apply one applicatorful to vagina at bedtime for 7 days 70 g 1    promethazine (PHENERGAN) 25 MG tablet Take 0.5-1 tablets (12.5-25 mg total) by mouth every 6 (six) hours as needed for nausea or vomiting. 30 tablet 0     Review of Systems  Constitutional: Negative.  Negative for fatigue and fever.  HENT: Negative.    Respiratory: Negative.  Negative for shortness of breath.   Cardiovascular: Negative.  Negative for chest pain.  Gastrointestinal: Negative.  Negative for abdominal pain, constipation, diarrhea, nausea and vomiting.  Genitourinary:  Positive for vaginal discharge and vaginal pain. Negative for dysuria.  Neurological: Negative.  Negative for dizziness and headaches.  Physical Exam   Blood pressure 110/70, pulse 94, temperature 98.2 F (36.8 C), temperature  source Oral, resp. rate 16, height 5\' 6"  (1.676 m), weight 89.1 kg, last menstrual period 03/01/2021, SpO2 98 %, unknown if currently breastfeeding.  Physical Exam Vitals and nursing note reviewed.  Constitutional:      General: She is not in acute distress.    Appearance: She is well-developed.  HENT:     Head: Normocephalic.  Eyes:     Pupils: Pupils are equal, round, and reactive to light.  Cardiovascular:     Rate and Rhythm: Normal rate and regular rhythm.     Heart sounds: Normal heart sounds.  Pulmonary:     Effort: Pulmonary effort is normal. No respiratory distress.     Breath sounds: Normal breath sounds.  Abdominal:     General: Bowel sounds are normal. There is no distension.     Palpations: Abdomen is soft.     Tenderness: There is no abdominal tenderness.  Genitourinary:    Comments: SSE: Pelvic exam: Cervix pink, visually closed,  without lesion, scant white creamy discharge, vaginal walls and external genitalia normal Skin:    General: Skin is warm and dry.  Neurological:     Mental Status: She is alert and oriented to person, place, and time.  Psychiatric:        Mood and Affect: Mood normal.        Behavior: Behavior normal.        Thought Content: Thought content normal.        Judgment: Judgment normal.   Fetal Tracing:  Baseline: 140 Variability: moderate Accels: 15x15 Decels: none  Toco: none   MAU Course  Procedures Results for orders placed or performed during the hospital encounter of 09/05/21 (from the past 24 hour(s))  Wet prep, genital     Status: Abnormal   Collection Time: 09/05/21 11:36 AM   Specimen: Vaginal  Result Value Ref Range   Yeast Wet Prep HPF POC PRESENT (A) NONE SEEN   Trich, Wet Prep NONE SEEN NONE SEEN   Clue Cells Wet Prep HPF POC NONE SEEN NONE SEEN   WBC, Wet Prep HPF POC MODERATE (A) NONE SEEN   Sperm NONE SEEN     MDM Wet prep and gc/chlamydia Lidocaine gel  Assessment and Plan   1. Candidiasis of vagina during pregnancy   2. [redacted] weeks gestation of pregnancy    -Discharge home in stable condition -Rx for terazol sent to patient's pharmacy -Second trimester precautions discussed -Patient advised to follow-up with OB as scheduled for prenatal care -Patient may return to MAU as needed or if her condition were to change or worsen   09/07/21 CNM 09/05/2021, 11:16 AM

## 2021-09-06 LAB — GC/CHLAMYDIA PROBE AMP (~~LOC~~) NOT AT ARMC
Chlamydia: POSITIVE — AB
Comment: NEGATIVE
Comment: NORMAL
Neisseria Gonorrhea: NEGATIVE

## 2021-09-10 ENCOUNTER — Telehealth: Payer: Self-pay

## 2021-09-10 DIAGNOSIS — A749 Chlamydial infection, unspecified: Secondary | ICD-10-CM

## 2021-09-10 LAB — OB RESULTS CONSOLE RPR: RPR: NONREACTIVE

## 2021-09-10 MED ORDER — AZITHROMYCIN 250 MG PO TABS: 1000.0000 mg | ORAL_TABLET | Freq: Once | ORAL | 0 refills | Status: AC

## 2021-09-10 NOTE — Telephone Encounter (Signed)
HIPPA compliant message left. Will send message through MyChart

## 2021-10-08 IMAGING — US US OB COMP LESS 14 WK
1 series · 15 of 28 positions shown · non-contrast
Comparison: Obstetric ultrasound dated 05/13/2020.

CLINICAL DATA: Vaginal bleeding, lower abdominal pain. Gestational
age by last menstrual period is 11 weeks 1 day.

EXAM:
OBSTETRIC <14 WK ULTRASOUND
TECHNIQUE: Transabdominal ultrasound was performed for evaluation of the
gestation as well as the maternal uterus and adnexal regions.

[Series 1: us ob comp less 14 wk · 38 acquisitions, 15 frames shown]
[im 1/38]
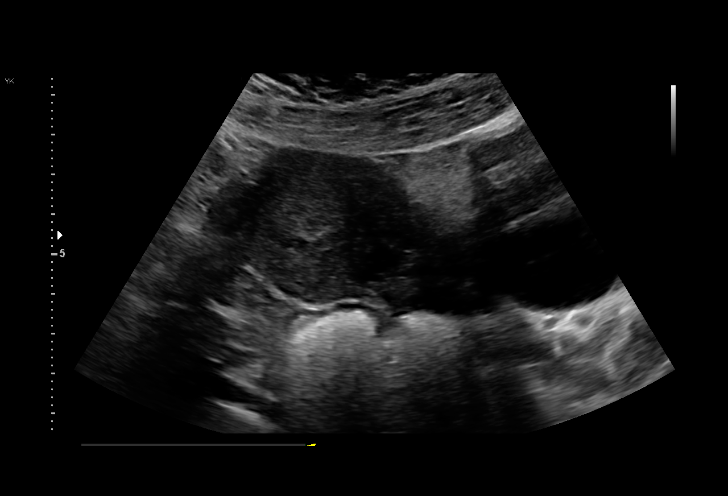
[im 3/38]
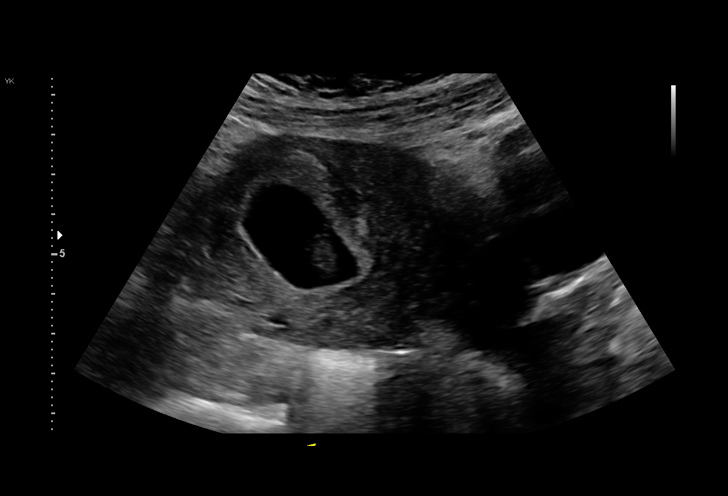
[im 6/38]
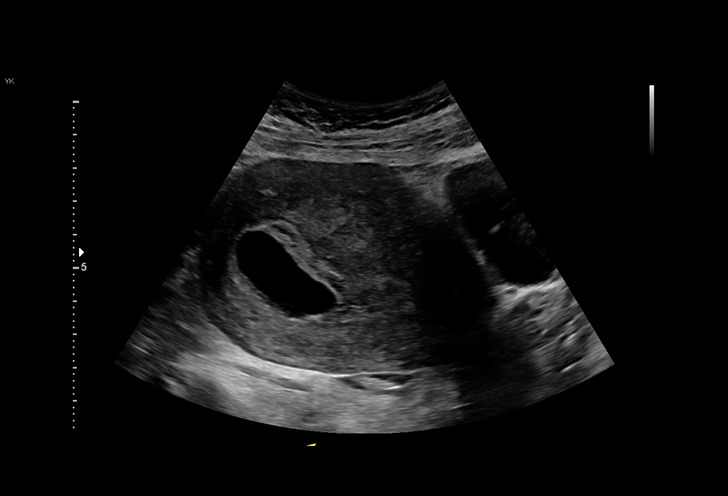
[im 9/38]
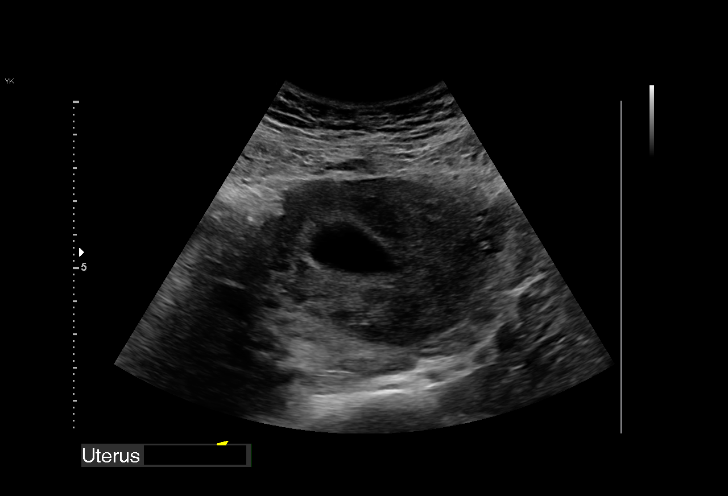
[im 11/38]
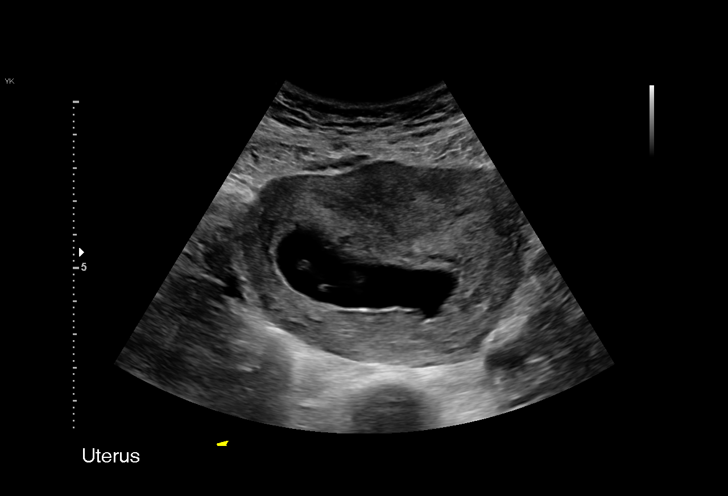
[im 14/38]
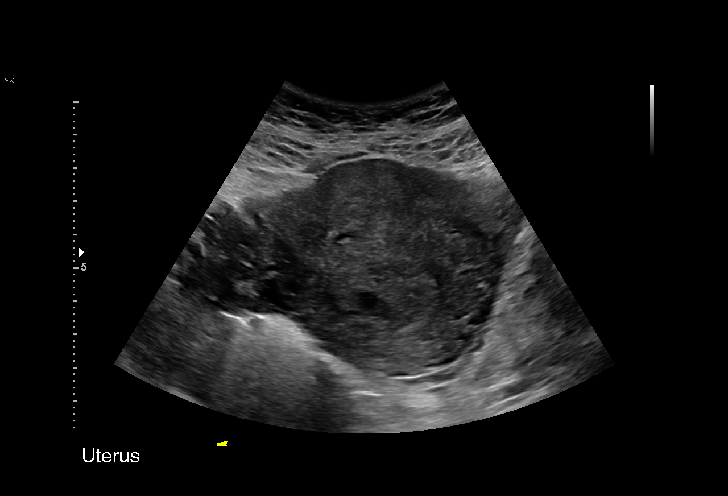
[im 17/38]
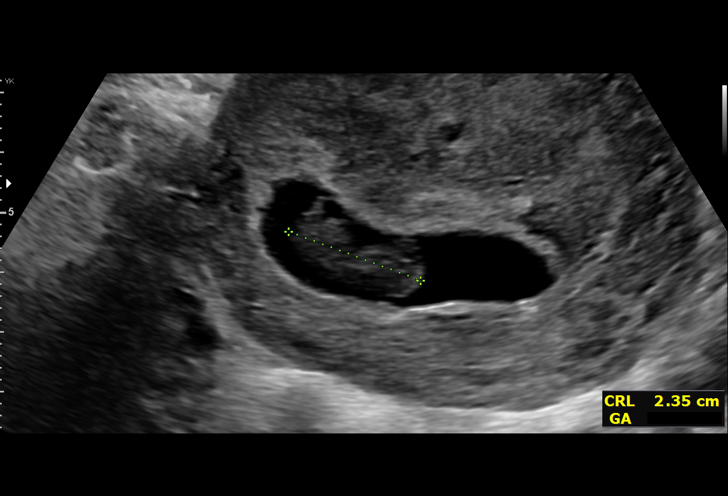
[im 20/38]
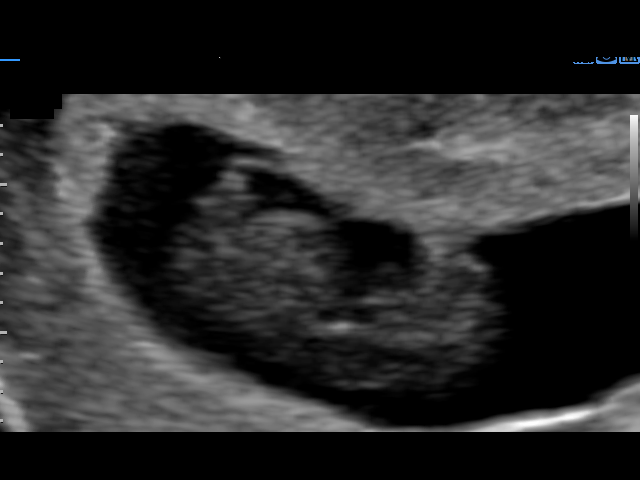
[im 21/38]
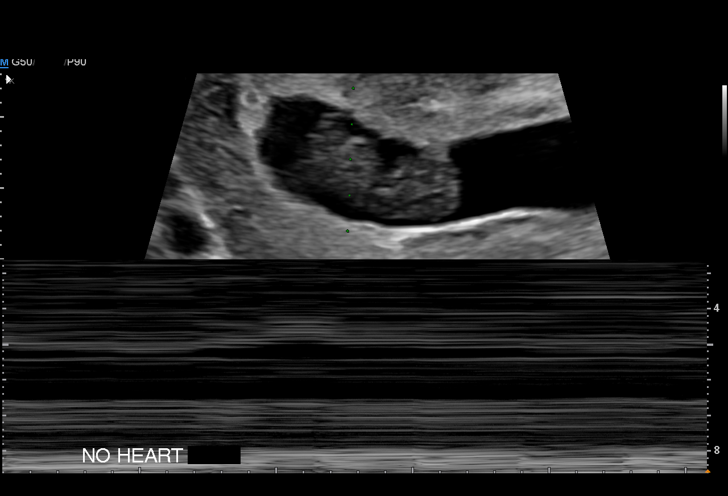
[im 24/38]
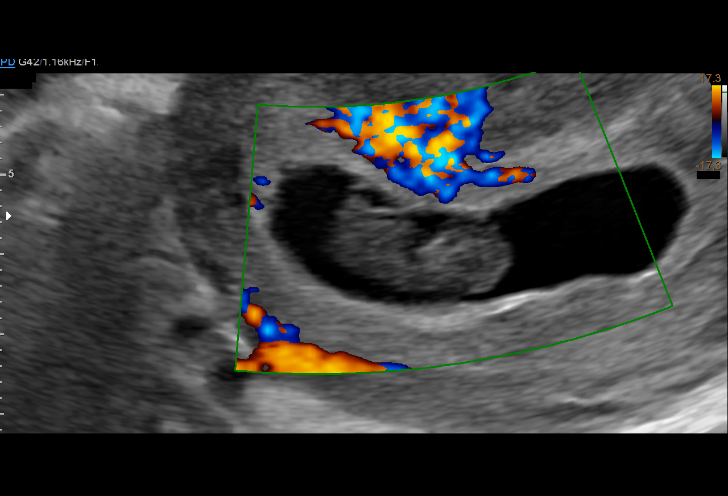
[im 27/38]
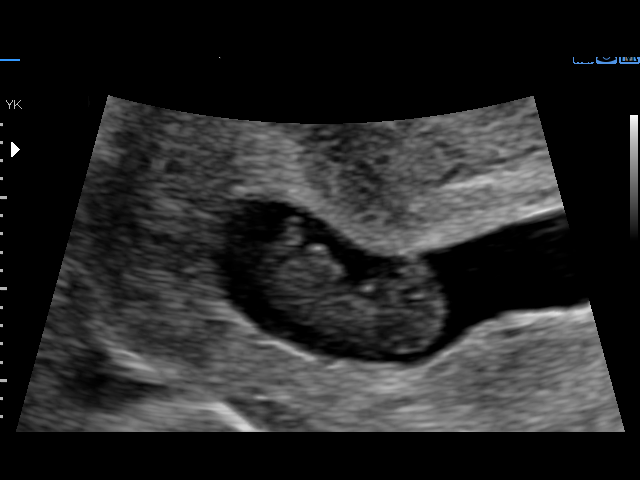
[im 29/38]
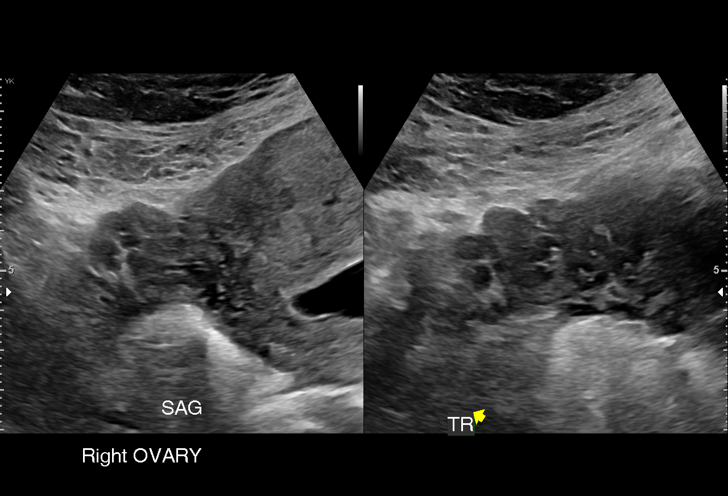
[im 32/38]
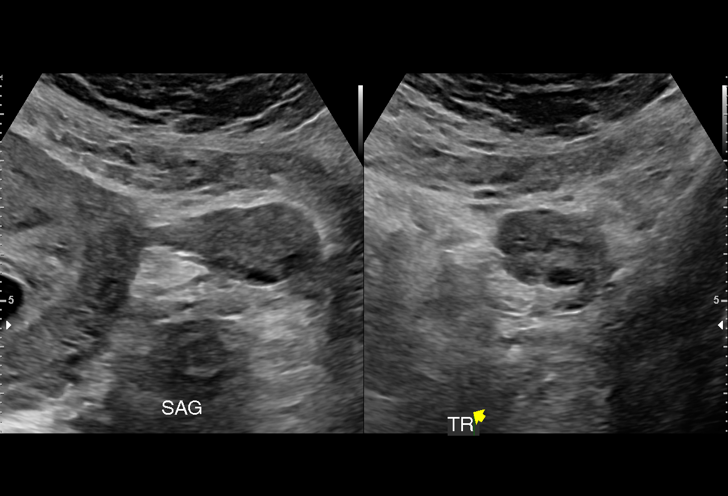
[im 35/38]
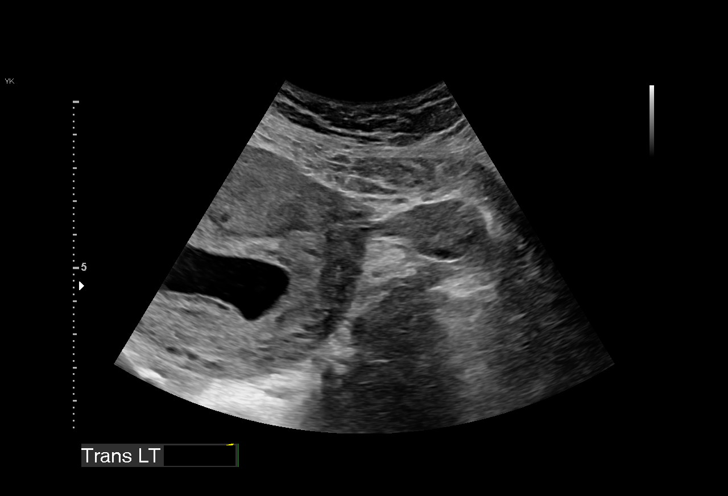
[im 38/38]
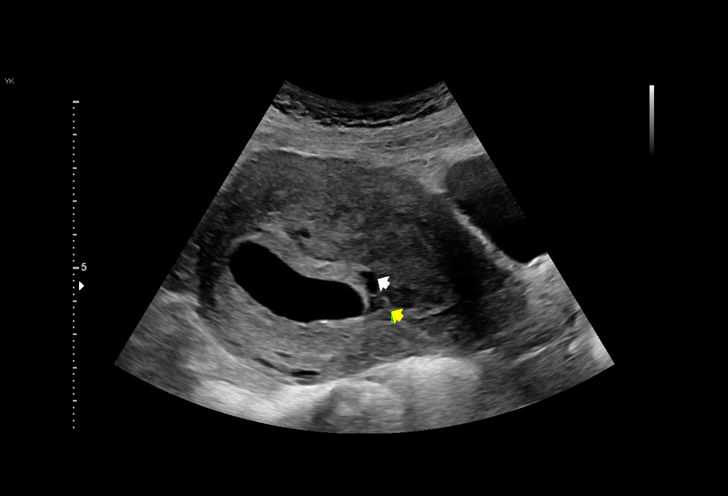

[15 of 28 positions shown; findings below may reference images not displayed]

FINDINGS: Intrauterine gestational sac: Single

Yolk sac:  Not Visualized.

Embryo:  Visualized.

Cardiac Activity: Not Visualized.

CRL:   22.8 mm   8 w 6 d                  US EDC: 05/24/2021

Subchorionic hemorrhage: Small subchorionic hemorrhage is
visualized.

Maternal uterus/adnexae: Normal appearing. No free fluid.
IMPRESSION: No evidence of fetal cardiac activity on M-mode, grey scale cine and
color Doppler, diagnostic of intrauterine fetal demise.

These results were called by telephone at the time of interpretation
on 10/18/2020 at [DATE] to provider NITIDO CHAMPLIN, who verbally
acknowledged these results.

## 2021-10-25 LAB — OB RESULTS CONSOLE GC/CHLAMYDIA
Chlamydia: NEGATIVE
Gonorrhea: NEGATIVE

## 2021-11-02 ENCOUNTER — Other Ambulatory Visit: Payer: Self-pay

## 2021-11-02 ENCOUNTER — Inpatient Hospital Stay (HOSPITAL_COMMUNITY)
Admission: AD | Admit: 2021-11-02 | Discharge: 2021-11-02 | Disposition: A | Discharge status: Home / Self Care | Payer: Medicaid Other | Attending: Obstetrics & Gynecology | Admitting: Obstetrics & Gynecology

## 2021-11-02 ENCOUNTER — Encounter (HOSPITAL_COMMUNITY): Payer: Self-pay | Admitting: Obstetrics & Gynecology

## 2021-11-02 DIAGNOSIS — Z3A35 35 weeks gestation of pregnancy: Secondary | ICD-10-CM | POA: Diagnosis not present

## 2021-11-02 DIAGNOSIS — R42 Dizziness and giddiness: Secondary | ICD-10-CM | POA: Insufficient documentation

## 2021-11-02 DIAGNOSIS — O99891 Other specified diseases and conditions complicating pregnancy: Secondary | ICD-10-CM

## 2021-11-02 DIAGNOSIS — R55 Syncope and collapse: Secondary | ICD-10-CM | POA: Diagnosis not present

## 2021-11-02 DIAGNOSIS — O26893 Other specified pregnancy related conditions, third trimester: Secondary | ICD-10-CM | POA: Diagnosis present

## 2021-11-02 DIAGNOSIS — Z87891 Personal history of nicotine dependence: Secondary | ICD-10-CM | POA: Insufficient documentation

## 2021-11-02 LAB — CBC
HCT: 33.4 % — ABNORMAL LOW (ref 36.0–46.0)
Hemoglobin: 11.1 g/dL — ABNORMAL LOW (ref 12.0–15.0)
MCH: 31.9 pg (ref 26.0–34.0)
MCHC: 33.2 g/dL (ref 30.0–36.0)
MCV: 96 fL (ref 80.0–100.0)
Platelets: 161 10*3/uL (ref 150–400)
RBC: 3.48 MIL/uL — ABNORMAL LOW (ref 3.87–5.11)
RDW: 13.6 % (ref 11.5–15.5)
WBC: 7.1 10*3/uL (ref 4.0–10.5)
nRBC: 0 % (ref 0.0–0.2)

## 2021-11-02 LAB — URINALYSIS, ROUTINE W REFLEX MICROSCOPIC
Bilirubin Urine: NEGATIVE
Glucose, UA: NEGATIVE mg/dL
Ketones, ur: NEGATIVE mg/dL
Leukocytes,Ua: NEGATIVE
Nitrite: NEGATIVE
Protein, ur: NEGATIVE mg/dL
Specific Gravity, Urine: 1.005 (ref 1.005–1.030)
pH: 7 (ref 5.0–8.0)

## 2021-11-02 LAB — COMPREHENSIVE METABOLIC PANEL
ALT: 10 U/L (ref 0–44)
AST: 15 U/L (ref 15–41)
Albumin: 2.6 g/dL — ABNORMAL LOW (ref 3.5–5.0)
Alkaline Phosphatase: 98 U/L (ref 38–126)
Anion gap: 5 (ref 5–15)
BUN: 5 mg/dL — ABNORMAL LOW (ref 6–20)
CO2: 21 mmol/L — ABNORMAL LOW (ref 22–32)
Calcium: 8.3 mg/dL — ABNORMAL LOW (ref 8.9–10.3)
Chloride: 108 mmol/L (ref 98–111)
Creatinine, Ser: 0.55 mg/dL (ref 0.44–1.00)
GFR, Estimated: >60 mL/min (ref >60)
Glucose, Bld: 100 mg/dL — ABNORMAL HIGH (ref 70–99)
Potassium: 3.9 mmol/L (ref 3.5–5.1)
Sodium: 134 mmol/L — ABNORMAL LOW (ref 135–145)
Total Bilirubin: 0.5 mg/dL (ref 0.3–1.2)
Total Protein: 5.2 g/dL — ABNORMAL LOW (ref 6.5–8.1)

## 2021-11-02 NOTE — MAU Provider Note (Addendum)
Patient Joanna Mcpherson is a 35 y.o. [phone] At [redacted]w[redacted]d here with complaints of dizziness and feeling lightheaded at work. This occurred at 11 am. She denies contractions, vaginal bleeding, decreased fetal movements. Right now she denies fever, SOB, chest pain. She denies any diabetes or HTN in this pregnancy. She was not standing when this happened; she was sitting down. This happened with her first child (11 years ago) one time. She did not see a cardiologist at that point.   Today she had a bacon egg and cheese biscuit around 10 am, and she also had water and sprite. She then felt dizzy and lightheaded at 11 and said she almost passed out. She did not fall and she did not pass out.  History     CSN: [id number]  Arrival date and time: 11/02/21 1226   Event Date/Time   First Provider Initiated Contact with Patient 11/02/21 1256      Chief Complaint  Patient presents with   Loss of Consciousness   Loss of Consciousness This is a new problem. The current episode started today. There was no loss of consciousness. Associated symptoms include dizziness and light-headedness. Pertinent negatives include no chest pain, confusion, diaphoresis, fever, nausea, visual change, vomiting or weakness.   OB History     Gravida  7   Para  3   Term  3   Preterm      AB  1   Living  3      SAB      IAB  1   Ectopic      Multiple  0   Live Births  3           Past Medical History:  Diagnosis Date   Abnormal Pap smear    Asthma    no issue in 30 yrs   Headache(784.0)    Hx of migraines last may 2021   Miscarriage 04/2020    Past Surgical History:  Procedure Laterality Date   ANKLE FRACTURE SURGERY Left 14 yrs ago   CERVICAL CONE BIOPSY     DILATION AND EVACUATION N/A 10/18/2020   Procedure: DILATATION AND EVACUATION;  Surgeon: Charlett Nose, MD;  Location: [facility];  Service: Gynecology;  Laterality: N/A;   elective abortion     HYSTEROSCOPY WITH D & C N/A 06/19/2020    Procedure: DILATATION AND CURETTAGE /HYSTEROSCOPY WITH HYSTEROSCOPIC MYOMECTOMY;  Surgeon: Waynard Reeds, MD;  Location: [facility];  Service: Gynecology;  Laterality: N/A;    Family History  Problem Relation Age of Onset   Diabetes Mother     Social History   Tobacco Use   Smoking status: Former    Packs/day: 0.50    Years: 6.00    Pack years: 3.00    Types: Cigarettes    Quit date: 12/19/2017    Years since quitting: 3.8   Smokeless tobacco: Never  Vaping Use   Vaping Use: Never used  Substance Use Topics   Alcohol use: Not Currently    Alcohol/week: 0.0 standard drinks   Drug use: No    Allergies: No Known Allergies  Medications Prior to Admission  Medication Sig Dispense Refill Last Dose   Prenatal Vit-Fe Fumarate-FA (MULTIVITAMIN-PRENATAL) 27-0.8 MG TABS tablet Take 1 tablet by mouth daily at 12 noon.   11/02/2021   promethazine (PHENERGAN) 25 MG tablet Take 0.5-1 tablets (12.5-25 mg total) by mouth every 6 (six) hours as needed for nausea or vomiting. 30 tablet 0 Unknown  terconazole (TERAZOL 7) 0.4 % vaginal cream Place 1 applicator vaginally at bedtime. 45 g 0     Review of Systems  Constitutional:  Negative for diaphoresis and fever.  Cardiovascular:  Positive for syncope. Negative for chest pain.  Gastrointestinal:  Negative for nausea and vomiting.  Neurological:  Positive for dizziness and light-headedness. Negative for weakness.  Psychiatric/Behavioral:  Negative for confusion.   Physical Exam   Blood pressure (!) 107/56, pulse 85, temperature 97.8 F (36.6 C), resp. rate 16, last menstrual period 03/01/2021, SpO2 100 %, unknown if currently breastfeeding.  Physical Exam Constitutional:      Appearance: Normal appearance.  HENT:     Head: Normocephalic.  Cardiovascular:     Rate and Rhythm: Normal rate.  Pulmonary:     Effort: Pulmonary effort is normal.  Abdominal:     General: Abdomen is flat.  Musculoskeletal:        General:  Normal range of motion.  Skin:    General: Skin is warm.     Capillary Refill: Capillary refill takes less than 2 seconds.  Neurological:     General: No focal deficit present.     Mental Status: She is alert.  Psychiatric:        Mood and Affect: Mood normal.   MAU Course  Procedures  MDM -NST: 135 bpm, mod var, present acel, no decels, no contractions -CBC is stable at 11.1; CMP is normal, no electrolyte imbalances. Urine shows no sign of dehydration although trace Hgb, patient is aferbile -EKG is normal Patient had snack and po hydration; she feels better.  Assessment and Plan   1. Near syncope   -keep appt on Friday -recommended rest, PO hydration -increase dietary intake of red meat -consider referral to cardiology if symptoms persist -fall precautions reviewed, 3rd trimester precautions reviewed.    Charlesetta Garibaldi Nickalus Thornsberry 11/02/2021, 1:00 PM

## 2021-11-02 NOTE — MAU Note (Addendum)
Pt arrival via EMS from pt's work. States she was sitting in a chair at 1100 and suddenly felt really dizzy, began sweating and ? Passed out. Did not fall. CBG 101 per EMS. Pt reports she has eaten at 1000. Denies vaginal bleeding or contractions. Reports positive fetal movement. Per EMS pt was hypotensive at the scene w/ b/p 80"s/40's

## 2021-11-16 LAB — OB RESULTS CONSOLE GBS: GBS: POSITIVE

## 2021-11-25 ENCOUNTER — Encounter (HOSPITAL_COMMUNITY): Payer: Self-pay | Admitting: Obstetrics and Gynecology

## 2021-11-25 ENCOUNTER — Other Ambulatory Visit: Payer: Self-pay

## 2021-11-25 ENCOUNTER — Inpatient Hospital Stay (EMERGENCY_DEPARTMENT_HOSPITAL)
Admission: AD | Admit: 2021-11-25 | Discharge: 2021-11-25 | Disposition: A | Discharge status: Home / Self Care | Payer: Medicaid Other | Source: Home / Self Care | Attending: Obstetrics and Gynecology | Admitting: Obstetrics and Gynecology

## 2021-11-25 DIAGNOSIS — O479 False labor, unspecified: Secondary | ICD-10-CM | POA: Diagnosis not present

## 2021-11-25 DIAGNOSIS — O471 False labor at or after 37 completed weeks of gestation: Secondary | ICD-10-CM | POA: Insufficient documentation

## 2021-11-25 DIAGNOSIS — Z3A38 38 weeks gestation of pregnancy: Secondary | ICD-10-CM | POA: Insufficient documentation

## 2021-11-25 NOTE — MAU Provider Note (Signed)
None      S: Ms. ALAIJA RUBLE is a 35 y.o. [phone] at [redacted]w[redacted]d  who presents to MAU today complaining contractions q 5 minutes since 0800. She denies vaginal bleeding. She endorses LOF. She reports normal fetal movement.    O: BP 113/64   Pulse 96   Temp 98.6 F (37 C)   Resp 20   Wt 89 kg   LMP 03/01/2021   SpO2 96%   BMI 31.66 kg/m  GENERAL: Well-developed, well-nourished female in no acute distress.  HEAD: Normocephalic, atraumatic.  CHEST: Normal effort of breathing, regular heart rate ABDOMEN: Soft, nontender, gravid  Cervical exam:  Dilation: 3 Effacement (%): 70 Cervical Position: Posterior Station: -3 Presentation: Vertex Exam by:: Zenia Resides, RN   Fetal Monitoring: Baseline: 150 Variability: Mod Accelerations: present Decelerations: neg Contractions: q3   A: SIUP at [redacted]w[redacted]d  False labor  P: Keep appt next Wednesday  for OB Strict labor precautions reviewed; patient verbalized understanding   Marylene Land, CNM 11/25/2021 2:54 PM

## 2021-11-25 NOTE — MAU Note (Signed)
Pt reports ctx's since 0800.   Denies vaginal bleeding or LOF.    Reports +FM

## 2021-11-26 ENCOUNTER — Inpatient Hospital Stay (HOSPITAL_COMMUNITY)
Admission: AD | Admit: 2021-11-26 | Discharge: 2021-11-28 | DRG: 807 | Disposition: A | Discharge status: Home / Self Care | Payer: Medicaid Other | Attending: Obstetrics and Gynecology | Admitting: Obstetrics and Gynecology

## 2021-11-26 ENCOUNTER — Other Ambulatory Visit: Payer: Self-pay | Admitting: Obstetrics and Gynecology

## 2021-11-26 ENCOUNTER — Encounter (HOSPITAL_COMMUNITY): Payer: Self-pay | Admitting: Obstetrics and Gynecology

## 2021-11-26 DIAGNOSIS — Z20822 Contact with and (suspected) exposure to covid-19: Secondary | ICD-10-CM | POA: Diagnosis present

## 2021-11-26 DIAGNOSIS — Z87891 Personal history of nicotine dependence: Secondary | ICD-10-CM | POA: Diagnosis not present

## 2021-11-26 DIAGNOSIS — K5900 Constipation, unspecified: Secondary | ICD-10-CM | POA: Diagnosis not present

## 2021-11-26 DIAGNOSIS — O26893 Other specified pregnancy related conditions, third trimester: Secondary | ICD-10-CM | POA: Diagnosis present

## 2021-11-26 DIAGNOSIS — O480 Post-term pregnancy: Secondary | ICD-10-CM | POA: Diagnosis present

## 2021-11-26 DIAGNOSIS — O99893 Other specified diseases and conditions complicating puerperium: Secondary | ICD-10-CM | POA: Diagnosis not present

## 2021-11-26 DIAGNOSIS — Z3A38 38 weeks gestation of pregnancy: Secondary | ICD-10-CM

## 2021-11-26 DIAGNOSIS — O99824 Streptococcus B carrier state complicating childbirth: Principal | ICD-10-CM | POA: Diagnosis present

## 2021-11-26 LAB — CBC
HCT: 36.4 % (ref 36.0–46.0)
Hemoglobin: 12.5 g/dL (ref 12.0–15.0)
MCH: 32.5 pg (ref 26.0–34.0)
MCHC: 34.3 g/dL (ref 30.0–36.0)
MCV: 94.5 fL (ref 80.0–100.0)
Platelets: 162 10*3/uL (ref 150–400)
RBC: 3.85 MIL/uL — ABNORMAL LOW (ref 3.87–5.11)
RDW: 13.9 % (ref 11.5–15.5)
WBC: 6.6 10*3/uL (ref 4.0–10.5)
nRBC: 0 % (ref 0.0–0.2)

## 2021-11-26 LAB — TYPE AND SCREEN
ABO/RH(D): O POS
Antibody Screen: NEGATIVE

## 2021-11-26 LAB — RESP PANEL BY RT-PCR (FLU A&B, COVID) ARPGX2
Influenza A by PCR: NEGATIVE
Influenza B by PCR: NEGATIVE
SARS Coronavirus 2 by RT PCR: NEGATIVE

## 2021-11-26 LAB — RPR: RPR Ser Ql: NONREACTIVE

## 2021-11-26 MED ORDER — PRENATAL MULTIVITAMIN CH
1.0000 | ORAL_TABLET | Freq: Every day | ORAL | Status: DC
  Administered 2021-11-27 – 2021-11-28 (×2): 1 via ORAL
  Filled 2021-11-26 (×2): qty 1

## 2021-11-26 MED ORDER — ZOLPIDEM TARTRATE 5 MG PO TABS: 5.0000 mg | ORAL_TABLET | Freq: Every evening | ORAL | Status: DC | PRN

## 2021-11-26 MED ORDER — DIBUCAINE (PERIANAL) 1 % EX OINT: 1.0000 "application " | TOPICAL_OINTMENT | CUTANEOUS | Status: DC | PRN

## 2021-11-26 MED ORDER — OXYTOCIN-SODIUM CHLORIDE 30-0.9 UT/500ML-% IV SOLN
2.5000 [IU]/h | INTRAVENOUS | Status: DC
  Administered 2021-11-26: 2.5 [IU]/h via INTRAVENOUS
  Filled 2021-11-26: qty 500

## 2021-11-26 MED ORDER — OXYCODONE HCL 5 MG PO TABS: 10.0000 mg | ORAL_TABLET | ORAL | Status: DC | PRN

## 2021-11-26 MED ORDER — LACTATED RINGERS IV SOLN
500.0000 mL | INTRAVENOUS | Status: DC | PRN
  Administered 2021-11-26: 1000 mL via INTRAVENOUS

## 2021-11-26 MED ORDER — OXYCODONE HCL 5 MG PO TABS
5.0000 mg | ORAL_TABLET | ORAL | Status: DC | PRN
  Administered 2021-11-26 – 2021-11-27 (×2): 5 mg via ORAL
  Filled 2021-11-26 (×2): qty 1

## 2021-11-26 MED ORDER — IBUPROFEN 600 MG PO TABS
600.0000 mg | ORAL_TABLET | Freq: Four times a day (QID) | ORAL | Status: DC
  Administered 2021-11-26 – 2021-11-28 (×8): 600 mg via ORAL
  Filled 2021-11-26 (×9): qty 1

## 2021-11-26 MED ORDER — OXYCODONE-ACETAMINOPHEN 5-325 MG PO TABS: 2.0000 | ORAL_TABLET | ORAL | Status: DC | PRN

## 2021-11-26 MED ORDER — ONDANSETRON HCL 4 MG/2ML IJ SOLN: 4.0000 mg | INTRAMUSCULAR | Status: DC | PRN

## 2021-11-26 MED ORDER — DIPHENHYDRAMINE HCL 25 MG PO CAPS: 25.0000 mg | ORAL_CAPSULE | Freq: Four times a day (QID) | ORAL | Status: DC | PRN

## 2021-11-26 MED ORDER — LIDOCAINE HCL (PF) 1 % IJ SOLN: 30.0000 mL | INTRAMUSCULAR | Status: DC | PRN

## 2021-11-26 MED ORDER — TETANUS-DIPHTH-ACELL PERTUSSIS 5-2.5-18.5 LF-MCG/0.5 IM SUSY: 0.5000 mL | PREFILLED_SYRINGE | Freq: Once | INTRAMUSCULAR | Status: DC

## 2021-11-26 MED ORDER — LACTATED RINGERS IV SOLN: INTRAVENOUS | Status: DC

## 2021-11-26 MED ORDER — PENICILLIN G POT IN DEXTROSE 60000 UNIT/ML IV SOLN
3.0000 10*6.[IU] | INTRAVENOUS | Status: DC
  Filled 2021-11-26 (×2): qty 50

## 2021-11-26 MED ORDER — BUTORPHANOL TARTRATE 1 MG/ML IJ SOLN
1.0000 mg | INTRAMUSCULAR | Status: DC | PRN
  Administered 2021-11-26: 1 mg via INTRAVENOUS
  Filled 2021-11-26: qty 1

## 2021-11-26 MED ORDER — WITCH HAZEL-GLYCERIN EX PADS: 1.0000 "application " | MEDICATED_PAD | CUTANEOUS | Status: DC | PRN

## 2021-11-26 MED ORDER — ONDANSETRON HCL 4 MG/2ML IJ SOLN: 4.0000 mg | Freq: Four times a day (QID) | INTRAMUSCULAR | Status: DC | PRN

## 2021-11-26 MED ORDER — OXYCODONE-ACETAMINOPHEN 5-325 MG PO TABS: 1.0000 | ORAL_TABLET | ORAL | Status: DC | PRN

## 2021-11-26 MED ORDER — SIMETHICONE 80 MG PO CHEW: 80.0000 mg | CHEWABLE_TABLET | ORAL | Status: DC | PRN

## 2021-11-26 MED ORDER — BENZOCAINE-MENTHOL 20-0.5 % EX AERO: 1.0000 "application " | INHALATION_SPRAY | CUTANEOUS | Status: DC | PRN

## 2021-11-26 MED ORDER — SOD CITRATE-CITRIC ACID 500-334 MG/5ML PO SOLN: 30.0000 mL | ORAL | Status: DC | PRN

## 2021-11-26 MED ORDER — SENNOSIDES-DOCUSATE SODIUM 8.6-50 MG PO TABS
2.0000 | ORAL_TABLET | Freq: Every day | ORAL | Status: DC
  Administered 2021-11-27 – 2021-11-28 (×3): 2 via ORAL
  Filled 2021-11-26 (×4): qty 2

## 2021-11-26 MED ORDER — COCONUT OIL OIL: 1.0000 "application " | TOPICAL_OIL | Status: DC | PRN

## 2021-11-26 MED ORDER — SODIUM CHLORIDE 0.9 % IV SOLN
5.0000 10*6.[IU] | Freq: Once | INTRAVENOUS | Status: AC
  Administered 2021-11-26: 5 10*6.[IU] via INTRAVENOUS
  Filled 2021-11-26: qty 5

## 2021-11-26 MED ORDER — FLEET ENEMA 7-19 GM/118ML RE ENEM: 1.0000 | ENEMA | RECTAL | Status: DC | PRN

## 2021-11-26 MED ORDER — ACETAMINOPHEN 325 MG PO TABS: 650.0000 mg | ORAL_TABLET | ORAL | Status: DC | PRN

## 2021-11-26 MED ORDER — ONDANSETRON HCL 4 MG PO TABS: 4.0000 mg | ORAL_TABLET | ORAL | Status: DC | PRN

## 2021-11-26 MED ORDER — OXYTOCIN BOLUS FROM INFUSION
333.0000 mL | Freq: Once | INTRAVENOUS | Status: AC
  Administered 2021-11-26: 333 mL via INTRAVENOUS

## 2021-11-26 MED ORDER — OXYTOCIN-SODIUM CHLORIDE 30-0.9 UT/500ML-% IV SOLN: 2.5000 [IU]/h | INTRAVENOUS | Status: DC | PRN

## 2021-11-26 NOTE — MAU Note (Signed)
Pt states has been contracting since yesterday am but stronger this am. Was 4cm yesterday in MAU. Denies LOF. Some bloody show and good FM

## 2021-11-26 NOTE — H&P (Signed)
Joanna Mcpherson is a 69 [id number].[id number] female presenting at 7 4/7 wks in labor. Pt reported contractions since yesterday.  She is dated per LMP which was confirmed with a 7week [place]. Her prenatal care has been otherwise uncomplicated. She is GBS pos ; NKDA  OB History     Gravida  7   Para  3   Term  3   Preterm      AB  1   Living  3      SAB      IAB  1   Ectopic      Multiple  0   Live Births  3          Past Medical History:  Diagnosis Date   Abnormal Pap smear    Asthma    no issue in 30 yrs   Headache(784.0)    Hx of migraines last may 2021   Miscarriage 04/2020   Past Surgical History:  Procedure Laterality Date   ANKLE FRACTURE SURGERY Left 14 yrs ago   CERVICAL CONE BIOPSY     DILATION AND EVACUATION N/A 10/18/2020   Procedure: DILATATION AND EVACUATION;  Surgeon: Charlett Nose, MD;  Location: [facility];  Service: Gynecology;  Laterality: N/A;   elective abortion     HYSTEROSCOPY WITH D & C N/A 06/19/2020   Procedure: DILATATION AND CURETTAGE /HYSTEROSCOPY WITH HYSTEROSCOPIC MYOMECTOMY;  Surgeon: Waynard Reeds, MD;  Location: [facility];  Service: Gynecology;  Laterality: N/A;   Family History: family history includes Diabetes in her mother. Social History:  reports that she quit smoking about 3 years ago. Her smoking use included cigarettes. She has a 3.00 pack-year smoking history. She has never used smokeless tobacco. She reports that she does not currently use alcohol. She reports that she does not use drugs.     Maternal Diabetes: No Genetic Screening: Declined Maternal Ultrasounds/Referrals: Normal Fetal Ultrasounds or other Referrals:  None Maternal Substance Abuse:  No Significant Maternal Medications:  None Significant Maternal Lab Results:  Group B Strep positive Other Comments:  None  Review of Systems  Constitutional:  Positive for activity change. Negative for fatigue.  Eyes:  Negative for photophobia and visual  disturbance.  Respiratory:  Negative for chest tightness and shortness of breath.   Cardiovascular:  Positive for leg swelling. Negative for chest pain and palpitations.  Gastrointestinal:  Positive for abdominal pain.  Genitourinary:  Positive for pelvic pain.  Neurological:  Negative for headaches.  Psychiatric/Behavioral:  The patient is not nervous/anxious.   Maternal Medical History:  Reason for admission: Contractions.   Contractions: Onset was more than 2 days ago.   Fetal activity: Perceived fetal activity is normal.   Prenatal complications: no prenatal complications Prenatal Complications - Diabetes: none.  Dilation: 5 Effacement (%): 80 Station: -2 Exam by:: J.Bellamy, RN Blood pressure 111/72, pulse 85, temperature 98.1 F (36.7 C), temperature source Oral, resp. rate 17, height 5\' 6"  (1.676 m), weight 88.9 kg, last menstrual period 03/01/2021, SpO2 97 %, unknown if currently breastfeeding. Maternal Exam:  Uterine Assessment: Contraction strength is mild.  Contraction frequency is irregular.  Abdomen: Patient reports generalized tenderness.  Estimated fetal weight is AGA.   Fetal presentation: vertex Introitus: Normal vulva. Vulva is negative for condylomata and lesion.  Normal vagina.  Cervix: Cervix evaluated by digital exam.     Fetal Exam Fetal Monitor Review: Baseline rate: 130.  Variability: moderate (6-25 bpm).   Pattern: accelerations present and no decelerations.  Fetal State Assessment: Category I - tracings are normal.  Physical Exam Vitals and nursing note reviewed. Exam conducted with a chaperone present.  Constitutional:      Appearance: Normal appearance. She is normal weight.  Pulmonary:     Effort: Pulmonary effort is normal.  Abdominal:     Tenderness: There is generalized abdominal tenderness.  Genitourinary:    General: Normal vulva.  Vulva is no lesion.  Musculoskeletal:        General: Normal range of motion.     Cervical back:  Normal range of motion.  Skin:    General: Skin is warm.  Neurological:     General: No focal deficit present.     Mental Status: She is alert and oriented to person, place, and time. Mental status is at baseline.  Psychiatric:        Mood and Affect: Mood normal.        Behavior: Behavior normal.        Thought Content: Thought content normal.        Judgment: Judgment normal.    Prenatal labs: ABO, Rh: --/--/O POS (12/09 0740) Antibody: NEG (12/09 0740) Rubella: Immune (05/03 0000) RPR: Nonreactive (09/23 0000)  HBsAg: Negative (05/03 0000)  HIV: Non-reactive (05/03 0000)  GBS: Positive/-- (11/29 0000)   Assessment/Plan: 83UM [id number] female presenting at 72 4/7wks in labor  - Admit  - PCN for GBS + -Cervix now 7/100/-2; AROM performed - clear fluid  - Pain control prn - Anticipate svd    Janean Sark Bethene Hankinson 11/26/2021, 10:44 AM

## 2021-11-26 NOTE — Progress Notes (Signed)
SVD baby girl skin to skin with mother 

## 2021-11-27 LAB — CBC
HCT: 35 % — ABNORMAL LOW (ref 36.0–46.0)
Hemoglobin: 11.9 g/dL — ABNORMAL LOW (ref 12.0–15.0)
MCH: 32.2 pg (ref 26.0–34.0)
MCHC: 34 g/dL (ref 30.0–36.0)
MCV: 94.6 fL (ref 80.0–100.0)
Platelets: 161 10*3/uL (ref 150–400)
RBC: 3.7 MIL/uL — ABNORMAL LOW (ref 3.87–5.11)
RDW: 13.8 % (ref 11.5–15.5)
WBC: 8.6 10*3/uL (ref 4.0–10.5)
nRBC: 0 % (ref 0.0–0.2)

## 2021-11-27 MED ORDER — POLYETHYLENE GLYCOL 3350 17 G PO PACK
17.0000 g | PACK | Freq: Every day | ORAL | Status: DC | PRN
  Administered 2021-11-27: 17 g via ORAL
  Filled 2021-11-27: qty 1

## 2021-11-27 NOTE — Progress Notes (Signed)
Patient request a stool softener. Patient was informed that she would receive a stool softener  in the am requested that I call the doctor to get an order for tonight. Consulted with my charge nurse and she states to give the am dose now. Will inform am nurse that the patient does not get the am dose .Will continue to monitor the patient

## 2021-11-27 NOTE — Progress Notes (Signed)
Post Partum Day 1 Subjective: Joanna Mcpherson is doing well this morning. C/o mild constipation, took stool softener this morning. Otherwise doing well, she is ambulating, voiding, tolerating PO. Minimal lochia, breastfeeding.   Objective: Patient Vitals for the past 24 hrs:  BP Temp Temp src Pulse Resp SpO2  11/27/21 0328 108/76 98.1 F (36.7 C) Oral 69 18 100 %  11/26/21 2345 110/79 98.1 F (36.7 C) Oral 69 18 100 %  11/26/21 1930 114/68 98.4 F (36.9 C) Oral 75 18 100 %  11/26/21 1500 108/73 -- -- 66 -- 100 %  11/26/21 1350 106/83 98.3 F (36.8 C) -- 70 -- --  11/26/21 1231 115/71 -- -- 68 -- --  11/26/21 1216 115/69 -- -- 62 -- --  11/26/21 1205 117/74 -- -- 63 -- --    Physical Exam:  General: alert, cooperative, and no distress Lochia: appropriate Uterine Fundus: firm DVT Evaluation: No evidence of DVT seen on physical exam.  Recent Labs    11/26/21 0740 11/27/21 0458  WBC 6.6 8.6  HGB 12.5 11.9*  HCT 36.4 35.0*  PLT 162 161    No results for input(s): NA, K, CL, CO2CT, BUN, CREATININE, GLUCOSE, BILITOT, ALT, AST, ALKPHOS, PROT, ALBUMIN in the last 72 hours.  No results for input(s): CALCIUM, MG, PHOS in the last 72 hours.  No results for input(s): PROTIME, APTT, INR in the last 72 hours.  No results for input(s): PROTIME, APTT, INR, FIBRINOGEN in the last 72 hours. Assessment/Plan:  Joanna Mcpherson 35 y.o. [id number] PPD#1 sp SVD 1. PPC: continue routine PP care, stool softener/miralax PRN constipation 2. Rh pos, rubella immune. S/p tdap prenatally 3. Dispo: peds planning to keep baby 48 hours due to <4 hrs abx for GBS, will plan d/c tomorrow   LOS: 1 day   Charlett Nose 11/27/2021, 8:58 AM

## 2021-11-28 MED ORDER — IBUPROFEN 200 MG PO TABS: 600.0000 mg | ORAL_TABLET | Freq: Four times a day (QID) | ORAL | Status: DC | PRN

## 2021-11-28 MED ORDER — ACETAMINOPHEN 325 MG PO TABS: 650.0000 mg | ORAL_TABLET | Freq: Four times a day (QID) | ORAL | Status: AC | PRN

## 2021-11-28 NOTE — Progress Notes (Signed)
Post Partum Day 2 Subjective: No complaint this morning. Ambulating, voiding, tolerating PO. Eager for discharge home.   Objective: Patient Vitals for the past 24 hrs:  BP Temp Temp src Pulse Resp SpO2  11/28/21 0516 106/69 98.2 F (36.8 C) Oral 76 18 100 %  11/27/21 2037 109/82 98.1 F (36.7 C) Oral 70 18 100 %  11/27/21 1500 103/73 98.4 F (36.9 C) Oral 68 18 --    Physical Exam:  General: alert, cooperative, and no distress Lochia: appropriate Uterine Fundus: firm DVT Evaluation: No evidence of DVT seen on physical exam.  Recent Labs    11/26/21 0740 11/27/21 0458  WBC 6.6 8.6  HGB 12.5 11.9*  HCT 36.4 35.0*  PLT 162 161    No results for input(s): NA, K, CL, CO2CT, BUN, CREATININE, GLUCOSE, BILITOT, ALT, AST, ALKPHOS, PROT, ALBUMIN in the last 72 hours.  No results for input(s): CALCIUM, MG, PHOS in the last 72 hours.  No results for input(s): PROTIME, APTT, INR in the last 72 hours.  No results for input(s): PROTIME, APTT, INR, FIBRINOGEN in the last 72 hours. Assessment/Plan:  Joanna Mcpherson 35 y.o. [id number] PPD#2 sp SVD 1. PPC: continue routine PP care 2. Rh pos, rubella immune. S/p tdap prenatally 3. Dispo: stable for discharge home. Discharge instructions reviewed.   LOS: 2 days   Charlett Nose 11/28/2021, 10:31 AM

## 2021-11-28 NOTE — Discharge Summary (Signed)
Postpartum Discharge Summary  Date of Service updated 11/28/21      Patient Name: Joanna Mcpherson DOB: 09-20-1986 MRN: [id number]  Date of admission: 11/26/2021 Delivery date:11/26/2021  Delivering provider: Carlynn Purl [facility]  Date of discharge: 11/28/2021  Admitting diagnosis: Post term pregnancy [O48.0] Intrauterine pregnancy: [redacted]w[redacted]d    Secondary diagnosis:  Principal Problem:   Post term pregnancy Active Problems:   SVD (spontaneous vaginal delivery)  Additional problems: none    Discharge diagnosis: Term Pregnancy Delivered                                              Post partum procedures: none Augmentation: AROM Complications: None  Hospital course: Onset of Labor With Vaginal Delivery      35y.o. yo [id number] admitted in Active Labor on 11/26/2021. Patient had an uncomplicated labor course as follows:  Membrane Rupture Time/Date: 10:58 AM ,11/26/2021   Delivery Method:Vaginal, Spontaneous  Episiotomy: None  Lacerations:    Patient had an uncomplicated postpartum course.  She is ambulating, tolerating a regular diet, passing flatus, and urinating well. Patient is discharged home in stable condition on 11/28/21.  Newborn Data: Birth date:11/26/2021  Birth time:11:50 AM  Gender:Female  Living status:Living  Apgars:8 ,9  Weight:3200 g   Magnesium Sulfate received: No BMZ received: No Rhophylac:N/A MMR:N/A T-DaP:Given prenatally Flu: No Transfusion:No  Physical exam  Vitals:   11/27/21 0328 11/27/21 1500 11/27/21 2037 11/28/21 0516  BP: 108/76 103/73 109/82 106/69  Pulse: 69 68 70 76  Resp: '18 18 18 18  ' Temp: 98.1 F (36.7 C) 98.4 F (36.9 C) 98.1 F (36.7 C) 98.2 F (36.8 C)  TempSrc: Oral Oral Oral Oral  SpO2: 100%  100% 100%  Weight:      Height:       General: alert, cooperative, and no distress Lochia: appropriate Uterine Fundus: firm Incision: N/A DVT Evaluation: No evidence of DVT seen on physical exam. Labs: Lab  Results  Component Value Date   WBC 8.6 11/27/2021   HGB 11.9 (L) 11/27/2021   HCT 35.0 (L) 11/27/2021   MCV 94.6 11/27/2021   PLT 161 11/27/2021   CMP Latest Ref Rng & Units 11/02/2021  Glucose 70 - 99 mg/dL 100(H)  BUN 6 - 20 mg/dL 5(L)  Creatinine 0.44 - 1.00 mg/dL 0.55  Sodium 135 - 145 mmol/L 134(L)  Potassium 3.5 - 5.1 mmol/L 3.9  Chloride 98 - 111 mmol/L 108  CO2 22 - 32 mmol/L 21(L)  Calcium 8.9 - 10.3 mg/dL 8.3(L)  Total Protein 6.5 - 8.1 g/dL 5.2(L)  Total Bilirubin 0.3 - 1.2 mg/dL 0.5  Alkaline Phos 38 - 126 U/L 98  AST 15 - 41 U/L 15  ALT 0 - 44 U/L 10   Edinburgh Score: Edinburgh Postnatal Depression Scale Screening Tool 11/27/2021  I have been able to laugh and see the funny side of things. 0  I have looked forward with enjoyment to things. 0  I have blamed myself unnecessarily when things went wrong. 1  I have been anxious or worried for no good reason. 2  I have felt scared or panicky for no good reason. 1  Things have been getting on top of me. 1  I have been so unhappy that I have had difficulty sleeping. 1  I have felt sad or miserable. 1  I have been so unhappy that I have been crying. 1  The thought of harming myself has occurred to me. 0  Edinburgh Postnatal Depression Scale Total 8      After visit meds:  Allergies as of 11/28/2021   No Known Allergies      Medication List     TAKE these medications    acetaminophen 325 MG tablet Commonly known as: Tylenol Take 2 tablets (650 mg total) by mouth every 6 (six) hours as needed (for pain scale < 4).   ibuprofen 200 MG tablet Commonly known as: ADVIL Take 3 tablets (600 mg total) by mouth every 6 (six) hours as needed.         Discharge home in stable condition Infant Feeding: Breast Infant Disposition:home with mother Discharge instruction: per After Visit Summary and Postpartum booklet. Activity: Advance as tolerated. Pelvic rest for 6 weeks.  Diet: routine diet Anticipated  Birth Control: Unsure Postpartum Appointment:4 weeks Additional Postpartum F/U:  none Future Appointments:No future appointments. Follow up Visit:  Follow-up Information     [facility], [facility]. Schedule an appointment as soon as possible for a visit in 4 week(s).   Contact information: [address] [place] [address] [phone]                     11/28/2021 Rowland Lathe, MD

## 2021-12-09 ENCOUNTER — Inpatient Hospital Stay (HOSPITAL_COMMUNITY)
Admission: EM | Admit: 2021-12-09 | Discharge: 2021-12-10 | DRG: 776 | Disposition: A | Discharge status: Home / Self Care | Payer: Medicaid Other | Attending: Obstetrics and Gynecology | Admitting: Obstetrics and Gynecology

## 2021-12-09 ENCOUNTER — Other Ambulatory Visit: Payer: Self-pay

## 2021-12-09 ENCOUNTER — Telehealth (HOSPITAL_COMMUNITY): Payer: Self-pay

## 2021-12-09 ENCOUNTER — Encounter (HOSPITAL_COMMUNITY): Payer: Self-pay | Admitting: Emergency Medicine

## 2021-12-09 ENCOUNTER — Inpatient Hospital Stay (HOSPITAL_COMMUNITY)
Admission: AD | Admit: 2021-12-09 | Payer: Medicaid Other | Source: Home / Self Care | Admitting: Obstetrics and Gynecology

## 2021-12-09 ENCOUNTER — Inpatient Hospital Stay (HOSPITAL_COMMUNITY): Payer: Medicaid Other

## 2021-12-09 DIAGNOSIS — O1495 Unspecified pre-eclampsia, complicating the puerperium: Secondary | ICD-10-CM | POA: Diagnosis present

## 2021-12-09 DIAGNOSIS — O9853 Other viral diseases complicating the puerperium: Secondary | ICD-10-CM | POA: Diagnosis present

## 2021-12-09 DIAGNOSIS — Z87891 Personal history of nicotine dependence: Secondary | ICD-10-CM | POA: Diagnosis not present

## 2021-12-09 DIAGNOSIS — R519 Headache, unspecified: Secondary | ICD-10-CM | POA: Diagnosis present

## 2021-12-09 DIAGNOSIS — U071 COVID-19: Secondary | ICD-10-CM | POA: Diagnosis present

## 2021-12-09 DIAGNOSIS — M436 Torticollis: Secondary | ICD-10-CM

## 2021-12-09 DIAGNOSIS — O165 Unspecified maternal hypertension, complicating the puerperium: Secondary | ICD-10-CM | POA: Diagnosis present

## 2021-12-09 LAB — URINALYSIS, MICROSCOPIC (REFLEX)
RBC / HPF: >50 RBC/hpf (ref 0–5)
Squamous Epithelial / HPF: NONE SEEN (ref 0–5)

## 2021-12-09 LAB — URINALYSIS, ROUTINE W REFLEX MICROSCOPIC
Glucose, UA: 100 mg/dL — AB
Ketones, ur: NEGATIVE mg/dL
Nitrite: POSITIVE — AB
Protein, ur: >300 mg/dL — AB
Specific Gravity, Urine: >1.03 — ABNORMAL HIGH (ref 1.005–1.030)
pH: 6.5 (ref 5.0–8.0)

## 2021-12-09 LAB — CBC WITH DIFFERENTIAL/PLATELET
Abs Immature Granulocytes: 0.01 10*3/uL (ref 0.00–0.07)
Basophils Absolute: 0 10*3/uL (ref 0.0–0.1)
Basophils Relative: 1 %
Eosinophils Absolute: 0.1 10*3/uL (ref 0.0–0.5)
Eosinophils Relative: 3 %
HCT: 38.8 % (ref 36.0–46.0)
Hemoglobin: 12.8 g/dL (ref 12.0–15.0)
Immature Granulocytes: 0 %
Lymphocytes Relative: 43 %
Lymphs Abs: 1.5 10*3/uL (ref 0.7–4.0)
MCH: 31.8 pg (ref 26.0–34.0)
MCHC: 33 g/dL (ref 30.0–36.0)
MCV: 96.3 fL (ref 80.0–100.0)
Monocytes Absolute: 0.3 10*3/uL (ref 0.1–1.0)
Monocytes Relative: 8 %
Neutro Abs: 1.7 10*3/uL (ref 1.7–7.7)
Neutrophils Relative %: 45 %
Platelets: 133 10*3/uL — ABNORMAL LOW (ref 150–400)
RBC: 4.03 MIL/uL (ref 3.87–5.11)
RDW: 13.3 % (ref 11.5–15.5)
WBC: 3.6 10*3/uL — ABNORMAL LOW (ref 4.0–10.5)
nRBC: 0 % (ref 0.0–0.2)

## 2021-12-09 LAB — COMPREHENSIVE METABOLIC PANEL
ALT: 13 U/L (ref 0–44)
AST: 15 U/L (ref 15–41)
Albumin: 3.3 g/dL — ABNORMAL LOW (ref 3.5–5.0)
Alkaline Phosphatase: 70 U/L (ref 38–126)
Anion gap: 7 (ref 5–15)
BUN: 10 mg/dL (ref 6–20)
CO2: 24 mmol/L (ref 22–32)
Calcium: 8.4 mg/dL — ABNORMAL LOW (ref 8.9–10.3)
Chloride: 109 mmol/L (ref 98–111)
Creatinine, Ser: 0.65 mg/dL (ref 0.44–1.00)
GFR, Estimated: >60 mL/min (ref >60)
Glucose, Bld: 89 mg/dL (ref 70–99)
Potassium: 3.6 mmol/L (ref 3.5–5.1)
Sodium: 140 mmol/L (ref 135–145)
Total Bilirubin: 0.6 mg/dL (ref 0.3–1.2)
Total Protein: 6.2 g/dL — ABNORMAL LOW (ref 6.5–8.1)

## 2021-12-09 LAB — RESP PANEL BY RT-PCR (FLU A&B, COVID) ARPGX2
Influenza A by PCR: NEGATIVE
Influenza B by PCR: NEGATIVE
SARS Coronavirus 2 by RT PCR: POSITIVE — AB

## 2021-12-09 MED ORDER — SODIUM CHLORIDE 0.9 % IV SOLN: 250.0000 mL | INTRAVENOUS | Status: DC | PRN

## 2021-12-09 MED ORDER — MEASLES, MUMPS & RUBELLA VAC IJ SOLR: 0.5000 mL | Freq: Once | INTRAMUSCULAR | Status: DC

## 2021-12-09 MED ORDER — LACTATED RINGERS IV SOLN: INTRAVENOUS | Status: DC

## 2021-12-09 MED ORDER — SENNOSIDES-DOCUSATE SODIUM 8.6-50 MG PO TABS
2.0000 | ORAL_TABLET | Freq: Every day | ORAL | Status: DC
  Administered 2021-12-10: 08:00:00 2 via ORAL
  Filled 2021-12-09: qty 2

## 2021-12-09 MED ORDER — BENZOCAINE-MENTHOL 20-0.5 % EX AERO: 1.0000 "application " | INHALATION_SPRAY | CUTANEOUS | Status: DC | PRN

## 2021-12-09 MED ORDER — ONDANSETRON HCL 4 MG PO TABS: 4.0000 mg | ORAL_TABLET | ORAL | Status: DC | PRN

## 2021-12-09 MED ORDER — ZOLPIDEM TARTRATE 5 MG PO TABS: 5.0000 mg | ORAL_TABLET | Freq: Every evening | ORAL | Status: DC | PRN

## 2021-12-09 MED ORDER — NIFEDIPINE ER OSMOTIC RELEASE 60 MG PO TB24
90.0000 mg | ORAL_TABLET | Freq: Every day | ORAL | Status: DC
  Administered 2021-12-09 – 2021-12-10 (×2): 90 mg via ORAL
  Filled 2021-12-09 (×3): qty 1

## 2021-12-09 MED ORDER — OXYCODONE-ACETAMINOPHEN 5-325 MG PO TABS
2.0000 | ORAL_TABLET | ORAL | Status: DC | PRN
  Administered 2021-12-09 – 2021-12-10 (×2): 2 via ORAL
  Filled 2021-12-09 (×2): qty 2

## 2021-12-09 MED ORDER — ONDANSETRON HCL 4 MG/2ML IJ SOLN
4.0000 mg | INTRAMUSCULAR | Status: DC | PRN
  Administered 2021-12-10: 10:00:00 4 mg via INTRAVENOUS
  Filled 2021-12-09: qty 2

## 2021-12-09 MED ORDER — PRENATAL MULTIVITAMIN CH
1.0000 | ORAL_TABLET | Freq: Every day | ORAL | Status: DC
  Administered 2021-12-10: 11:00:00 1 via ORAL
  Filled 2021-12-09: qty 1

## 2021-12-09 MED ORDER — SODIUM CHLORIDE 0.9% FLUSH: 3.0000 mL | INTRAVENOUS | Status: DC | PRN

## 2021-12-09 MED ORDER — COCONUT OIL OIL: 1.0000 "application " | TOPICAL_OIL | Status: DC | PRN

## 2021-12-09 MED ORDER — TETANUS-DIPHTH-ACELL PERTUSSIS 5-2.5-18.5 LF-MCG/0.5 IM SUSY: 0.5000 mL | PREFILLED_SYRINGE | Freq: Once | INTRAMUSCULAR | Status: DC

## 2021-12-09 MED ORDER — DIBUCAINE (PERIANAL) 1 % EX OINT: 1.0000 "application " | TOPICAL_OINTMENT | CUTANEOUS | Status: DC | PRN

## 2021-12-09 MED ORDER — LABETALOL HCL 5 MG/ML IV SOLN: 40.0000 mg | INTRAVENOUS | Status: DC | PRN

## 2021-12-09 MED ORDER — LABETALOL HCL 5 MG/ML IV SOLN
20.0000 mg | Freq: Once | INTRAVENOUS | Status: AC
  Administered 2021-12-09: 12:00:00 20 mg via INTRAVENOUS
  Filled 2021-12-09: qty 4

## 2021-12-09 MED ORDER — HYDRALAZINE HCL 20 MG/ML IJ SOLN: 10.0000 mg | INTRAMUSCULAR | Status: DC | PRN

## 2021-12-09 MED ORDER — IBUPROFEN 800 MG PO TABS
800.0000 mg | ORAL_TABLET | Freq: Three times a day (TID) | ORAL | Status: DC
  Administered 2021-12-09 – 2021-12-10 (×4): 800 mg via ORAL
  Filled 2021-12-09 (×4): qty 1

## 2021-12-09 MED ORDER — DIPHENHYDRAMINE HCL 25 MG PO CAPS: 25.0000 mg | ORAL_CAPSULE | Freq: Four times a day (QID) | ORAL | Status: DC | PRN

## 2021-12-09 MED ORDER — LABETALOL HCL 5 MG/ML IV SOLN: 20.0000 mg | INTRAVENOUS | Status: DC | PRN

## 2021-12-09 MED ORDER — SODIUM CHLORIDE 0.9% FLUSH: 3.0000 mL | Freq: Two times a day (BID) | INTRAVENOUS | Status: DC

## 2021-12-09 MED ORDER — MAGNESIUM HYDROXIDE 400 MG/5ML PO SUSP: 30.0000 mL | ORAL | Status: DC | PRN

## 2021-12-09 MED ORDER — METHYLERGONOVINE MALEATE 0.2 MG PO TABS: 0.2000 mg | ORAL_TABLET | ORAL | Status: DC | PRN

## 2021-12-09 MED ORDER — LABETALOL HCL 5 MG/ML IV SOLN: 80.0000 mg | INTRAVENOUS | Status: DC | PRN

## 2021-12-09 MED ORDER — SIMETHICONE 80 MG PO CHEW: 80.0000 mg | CHEWABLE_TABLET | ORAL | Status: DC | PRN

## 2021-12-09 MED ORDER — ACETAMINOPHEN 325 MG PO TABS: 650.0000 mg | ORAL_TABLET | ORAL | Status: DC | PRN

## 2021-12-09 MED ORDER — WITCH HAZEL-GLYCERIN EX PADS: 1.0000 "application " | MEDICATED_PAD | CUTANEOUS | Status: DC | PRN

## 2021-12-09 MED ORDER — METHYLERGONOVINE MALEATE 0.2 MG/ML IJ SOLN: 0.2000 mg | INTRAMUSCULAR | Status: DC | PRN

## 2021-12-09 MED ORDER — MAGNESIUM SULFATE BOLUS VIA INFUSION
4.0000 g | Freq: Once | INTRAVENOUS | Status: AC
  Administered 2021-12-09: 14:00:00 4 g via INTRAVENOUS
  Filled 2021-12-09: qty 1000

## 2021-12-09 MED ORDER — MAGNESIUM SULFATE 40 GM/1000ML IV SOLN
2.0000 g/h | INTRAVENOUS | Status: AC
  Administered 2021-12-09 – 2021-12-10 (×2): 2 g/h via INTRAVENOUS
  Filled 2021-12-09 (×2): qty 1000

## 2021-12-09 MED ORDER — KETOROLAC TROMETHAMINE 30 MG/ML IJ SOLN
30.0000 mg | Freq: Once | INTRAMUSCULAR | Status: AC
  Administered 2021-12-09: 12:00:00 30 mg via INTRAVENOUS
  Filled 2021-12-09: qty 1

## 2021-12-09 NOTE — ED Provider Notes (Signed)
Emergency Medicine Provider Triage Evaluation Note  Joanna Mcpherson , a 35 y.o. female  was evaluated in triage.  Pt complains of headache and neck pain for the past five days. Worsened neck pain today. Denies any falls, MVCs, traumas. No h/o HTN. Recent delivery 12 days ago. Normal, healthy pregnancy. Normal vaginal delivery. Reports some blurry vision.  Review of Systems  Positive: Headache, neck pain Negative: Nausea, vomiting, chest pain, SOB  Physical Exam  BP (!) 192/116 (BP Location: Right Arm)    Pulse 64    Temp 98.4 F (36.9 C) (Oral)    Resp 18    Ht 5\' 6"  (1.676 m)    Wt 85.3 kg    SpO2 100%    BMI 30.34 kg/m  Gen:   Awake, no distress   Resp:  Normal effort  MSK:   Moves extremities without difficulty  Other:  Tenderness to the upper right occipital region on the head/neck. The patient is able to move chin to chest with pain.   Medical Decision Making  Medically screening exam initiated at 11:15 AM.  Appropriate orders placed.  Joanna Mcpherson was informed that the remainder of the evaluation will be completed by another provider, this initial triage assessment does not replace that evaluation, and the importance of remaining in the ED until their evaluation is complete.  Labs ordered. Nursing aware that patient needs to be roomed now.    Glynis Smiles, PA-C 12/09/21 1118    12/11/21, MD 12/09/21 [phone]

## 2021-12-09 NOTE — H&P (Signed)
35 y.o. [id number] postpartum day 12 with headache and neckache for about 5 days.  Pt did not have any GHTN during or after delivery.  Pt presented to ER today and found her to have severe range blood pressures. ER doc began hypertensive protocol and started magnesium sulfate.  Pt admitted for probable post partum severe preeclampsia.  Past Medical History:  Diagnosis Date   Abnormal Pap smear    Asthma    no issue in 30 yrs   Headache(784.0)    Hx of migraines last may 2021   Miscarriage 04/2020   Past Surgical History:  Procedure Laterality Date   ANKLE FRACTURE SURGERY Left 14 yrs ago   CERVICAL CONE BIOPSY     DILATION AND EVACUATION N/A 10/18/2020   Procedure: DILATATION AND EVACUATION;  Surgeon: Charlett Nose, MD;  Location: [facility];  Service: Gynecology;  Laterality: N/A;   elective abortion     HYSTEROSCOPY WITH D & C N/A 06/19/2020   Procedure: DILATATION AND CURETTAGE /HYSTEROSCOPY WITH HYSTEROSCOPIC MYOMECTOMY;  Surgeon: Waynard Reeds, MD;  Location: [facility];  Service: Gynecology;  Laterality: N/A;    Social History   Socioeconomic History   Marital status: Divorced    Spouse name: Afsheen Antony   Number of children: 2   Years of education: Not on file   Highest education level: Not on file  Occupational History   Occupation: Clinical biochemist Rep    Employer: [employer]  Tobacco Use   Smoking status: Former    Packs/day: 0.50    Years: 6.00    Pack years: 3.00    Types: Cigarettes    Quit date: 12/19/2017    Years since quitting: 3.9   Smokeless tobacco: Never  Vaping Use   Vaping Use: Never used  Substance and Sexual Activity   Alcohol use: Not Currently    Alcohol/week: 0.0 standard drinks   Drug use: No   Sexual activity: Yes    Partners: Male  Other Topics Concern   Not on file  Social History Narrative   Not on file   Social Determinants of Health   Financial Resource Strain: Not on file  Food Insecurity: Not on file  Transportation  Needs: Not on file  Physical Activity: Not on file  Stress: Not on file  Social Connections: Not on file  Intimate Partner Violence: Not on file    No current facility-administered medications on file prior to encounter.   Current Outpatient Medications on File Prior to Encounter  Medication Sig Dispense Refill   acetaminophen (TYLENOL) 325 MG tablet Take 2 tablets (650 mg total) by mouth every 6 (six) hours as needed (for pain scale < 4).     ibuprofen (ADVIL) 200 MG tablet Take 3 tablets (600 mg total) by mouth every 6 (six) hours as needed.      No Known Allergies  Vitals:   12/09/21 1102 12/09/21 1128 12/09/21 1130 12/09/21 1200  BP:  (!) 165/106 (!) 172/111 (!) 193/111  Pulse:   62 61  Resp:  17 18 17   Temp:      TempSrc:      SpO2:  99% 100% 98%  Weight: 85.3 kg     Height: 5\' 6"  (1.676 m)       Lungs: clear to ascultation Cor:  RRR Abdomen:  soft, nontender, nondistended. Ex:  no cords, erythema Pelvic:  defered  Results for orders placed or performed during the hospital encounter of 12/09/21 (from the past 24  hour(s))  Comprehensive metabolic panel     Status: Abnormal   Collection Time: 12/09/21 11:58 AM  Result Value Ref Range   Sodium 140 135 - 145 mmol/L   Potassium 3.6 3.5 - 5.1 mmol/L   Chloride 109 98 - 111 mmol/L   CO2 24 22 - 32 mmol/L   Glucose, Bld 89 70 - 99 mg/dL   BUN 10 6 - 20 mg/dL   Creatinine, Ser 5.40 0.44 - 1.00 mg/dL   Calcium 8.4 (L) 8.9 - 10.3 mg/dL   Total Protein 6.2 (L) 6.5 - 8.1 g/dL   Albumin 3.3 (L) 3.5 - 5.0 g/dL   AST 15 15 - 41 U/L   ALT 13 0 - 44 U/L   Alkaline Phosphatase 70 38 - 126 U/L   Total Bilirubin 0.6 0.3 - 1.2 mg/dL   GFR, Estimated >98 >11 mL/min   Anion gap 7 5 - 15  CBC with Differential     Status: Abnormal   Collection Time: 12/09/21 11:58 AM  Result Value Ref Range   WBC 3.6 (L) 4.0 - 10.5 K/uL   RBC 4.03 3.87 - 5.11 MIL/uL   Hemoglobin 12.8 12.0 - 15.0 g/dL   HCT 91.4 78.2 - 95.6 %   MCV 96.3 80.0  - 100.0 fL   MCH 31.8 26.0 - 34.0 pg   MCHC 33.0 30.0 - 36.0 g/dL   RDW 21.3 08.6 - 57.8 %   Platelets 133 (L) 150 - 400 K/uL   nRBC 0.0 0.0 - 0.2 %   Neutrophils Relative % 45 %   Neutro Abs 1.7 1.7 - 7.7 K/uL   Lymphocytes Relative 43 %   Lymphs Abs 1.5 0.7 - 4.0 K/uL   Monocytes Relative 8 %   Monocytes Absolute 0.3 0.1 - 1.0 K/uL   Eosinophils Relative 3 %   Eosinophils Absolute 0.1 0.0 - 0.5 K/uL   Basophils Relative 1 %   Basophils Absolute 0.0 0.0 - 0.1 K/uL   Immature Granulocytes 0 %   Abs Immature Granulocytes 0.01 0.00 - 0.07 K/uL   Reactive, Benign Lymphocytes PRESENT   Resp Panel by RT-PCR (Flu A&B, Covid) Nasopharyngeal Swab     Status: Abnormal   Collection Time: 12/09/21 12:41 PM   Specimen: Nasopharyngeal Swab; Nasopharyngeal(NP) swabs in vial transport medium  Result Value Ref Range   SARS Coronavirus 2 by RT PCR POSITIVE (A) NEGATIVE   Influenza A by PCR NEGATIVE NEGATIVE   Influenza B by PCR NEGATIVE NEGATIVE     A:  35 y.o. [id number] post partum day 12 with severe range blood pressures and head/neck ache. Will admit for suspected severe preeclampsia.   P: Magnesium sulfate and hypertensive protocol. Transfer to [facility]. Repeat labs in am; plts were normal but on low side. Will consider Head CT if pain is not better with meds or if BPs unable to be controlled.   Incidentally positive for Covid- will begin paxlovid when able to take to minimize the severity of covid.     Loney Laurence

## 2021-12-09 NOTE — ED Provider Notes (Signed)
[facility] EMERGENCY DEPARTMENT Provider Note  CSN: [id number] Arrival date & time: 12/09/21 1053    History Chief Complaint  Patient presents with   Headache   neck stiffness     Joanna Mcpherson is a 35 y.o. female who is approx 12 days postpartum after SVD of a healthy infant at approx 38.5wks. She had no complications during her pregnancy. She is here today with 5 days of severe sharp frontal headache, and neck pains worse with movement. Not associated with N/V/D or fever. She is not breastfeeding. Her bleeding is stopped.    Past Medical History:  Diagnosis Date   Abnormal Pap smear    Asthma    no issue in 30 yrs   Headache(784.0)    Hx of migraines last may 2021   Miscarriage 04/2020    Past Surgical History:  Procedure Laterality Date   ANKLE FRACTURE SURGERY Left 14 yrs ago   CERVICAL CONE BIOPSY     DILATION AND EVACUATION N/A 10/18/2020   Procedure: DILATATION AND EVACUATION;  Surgeon: Charlett Nose, MD;  Location: [facility] OR;  Service: Gynecology;  Laterality: N/A;   elective abortion     HYSTEROSCOPY WITH D & C N/A 06/19/2020   Procedure: DILATATION AND CURETTAGE /HYSTEROSCOPY WITH HYSTEROSCOPIC MYOMECTOMY;  Surgeon: Waynard Reeds, MD;  Location: [facility];  Service: Gynecology;  Laterality: N/A;    Family History  Problem Relation Age of Onset   Diabetes Mother     Social History   Tobacco Use   Smoking status: Former    Packs/day: 0.50    Years: 6.00    Pack years: 3.00    Types: Cigarettes    Quit date: 12/19/2017    Years since quitting: 3.9   Smokeless tobacco: Never  Vaping Use   Vaping Use: Never used  Substance Use Topics   Alcohol use: Not Currently    Alcohol/week: 0.0 standard drinks   Drug use: No     Home Medications Prior to Admission medications   Medication Sig Start Date End Date Taking? Authorizing Provider  acetaminophen (TYLENOL) 325 MG tablet Take 2 tablets (650 mg total) by mouth every 6 (six) hours  as needed (for pain scale < 4). Patient taking differently: Take 650 mg by mouth every 6 (six) hours as needed for mild pain. 11/28/21  Yes Charlett Nose, MD  aspirin-acetaminophen-caffeine (EXCEDRIN MIGRAINE) [phone] MG tablet Take 2 tablets by mouth every 6 (six) hours as needed for headache.   Yes [provider]  ibuprofen (ADVIL) 200 MG tablet Take 3 tablets (600 mg total) by mouth every 6 (six) hours as needed. Patient not taking: Reported on 12/09/2021 11/28/21   Charlett Nose, MD     Allergies    Patient has no known allergies.   Review of Systems   Review of Systems A comprehensive review of systems was completed and negative except as noted in HPI.    Physical Exam BP (!) 165/99    Pulse 77    Temp 98.4 F (36.9 C) (Oral)    Resp 18    Ht 5\' 6"  (1.676 m)    Wt 85.3 kg    SpO2 97%    BMI 30.34 kg/m   Physical Exam Vitals and nursing note reviewed.  Constitutional:      Appearance: Normal appearance.  HENT:     Head: Normocephalic and atraumatic.     Nose: Nose normal.     Mouth/Throat:  Mouth: Mucous membranes are moist.  Eyes:     Extraocular Movements: Extraocular movements intact.     Conjunctiva/sclera: Conjunctivae normal.  Neck:     Comments: She has pain with ROM, but no meningismus Cardiovascular:     Rate and Rhythm: Normal rate.  Pulmonary:     Effort: Pulmonary effort is normal.     Breath sounds: Normal breath sounds.  Abdominal:     General: Abdomen is flat.     Palpations: Abdomen is soft.     Tenderness: There is no abdominal tenderness.  Musculoskeletal:        General: No swelling. Normal range of motion.     Cervical back: Normal range of motion and neck supple. Tenderness (R paraspinal muscles) present. No rigidity.  Skin:    General: Skin is warm and dry.  Neurological:     General: No focal deficit present.     Mental Status: She is alert.  Psychiatric:        Mood and Affect: Mood normal.     ED  Results / Procedures / Treatments   Labs (all labs ordered are listed, but only abnormal results are displayed) Labs Reviewed  RESP PANEL BY RT-PCR (FLU A&B, COVID) ARPGX2 - Abnormal; Notable for the following components:      Result Value   SARS Coronavirus 2 by RT PCR POSITIVE (*)    All other components within normal limits  COMPREHENSIVE METABOLIC PANEL - Abnormal; Notable for the following components:   Calcium 8.4 (*)    Total Protein 6.2 (*)    Albumin 3.3 (*)    All other components within normal limits  CBC WITH DIFFERENTIAL/PLATELET - Abnormal; Notable for the following components:   WBC 3.6 (*)    Platelets 133 (*)    All other components within normal limits  URINALYSIS, ROUTINE W REFLEX MICROSCOPIC    EKG None  Radiology No results found.  Procedures Procedures  Medications Ordered in the ED Medications  magnesium sulfate 40 grams in SWI 1000 mL OB infusion (2 g/hr Intravenous New Bag/Given 12/09/21 1326)  NIFEdipine (PROCARDIA-XL/NIFEDICAL-XL) 24 hr tablet 90 mg (90 mg Oral Given 12/09/21 1507)  labetalol (NORMODYNE) injection 20 mg (has no administration in time range)    And  labetalol (NORMODYNE) injection 40 mg (has no administration in time range)    And  labetalol (NORMODYNE) injection 80 mg (has no administration in time range)    And  hydrALAZINE (APRESOLINE) injection 10 mg (has no administration in time range)  lactated ringers infusion ( Intravenous New Bag/Given 12/09/21 1506)  labetalol (NORMODYNE) injection 20 mg (20 mg Intravenous Given 12/09/21 1151)  ketorolac (TORADOL) 30 MG/ML injection 30 mg (30 mg Intravenous Given 12/09/21 1215)  magnesium bolus via infusion 4 g (4 g Intravenous Bolus from Bag 12/09/21 1332)     MDM Rules/Calculators/A&P MDM Patient with markedly elevated BP, no prior history of same with headache in the recent post-partum period. HELLP labs ordered, Labetalol for BP. SPoke with Dr. Henderson Cloud, Ob/Gyn who requests  Magnesium load/drip and transfer to [facility]. She is also OK with Toradol for headache. Patient understands the plan and is in agreement. [facility] is aware.   ED Course  I have reviewed the triage vital signs and the nursing notes.  Pertinent labs & imaging results that were available during my care of the patient were reviewed by me and considered in my medical decision making (see chart for details).  Clinical Course as of 12/09/21  2500  Thu Dec 09, 2021  1226 CBC with mild leukopenia. Normal Hgb. [CS]    Clinical Course User Index [CS] Pollyann Savoy, MD    Final Clinical Impression(s) / ED Diagnoses Final diagnoses:  Postpartum hypertension  Torticollis    Rx / DC Orders ED Discharge Orders     None        Pollyann Savoy, MD 12/09/21 [phone]

## 2021-12-09 NOTE — ED Triage Notes (Signed)
Has had a worst headache (top of forehead) x5 days, reports pain turning her head to the right. Did not get an epidural. Denies vision changes, denies N/V. Has been taking Tylenol and Excedrin migraine w/o relief. States she had a baby 12 days ago, vaginal delivery, no complications.

## 2021-12-09 NOTE — Telephone Encounter (Signed)
Pt states I am at the [facility] ER for my blood pressure about to be transferred to [facility]. RN told patient that we hope she feels better and will be in touch at a different time.   Marcelino Duster [facility] 12/09/2021,1240

## 2021-12-10 LAB — CBC
HCT: 38.4 % (ref 36.0–46.0)
Hemoglobin: 12.9 g/dL (ref 12.0–15.0)
MCH: 31.5 pg (ref 26.0–34.0)
MCHC: 33.6 g/dL (ref 30.0–36.0)
MCV: 93.9 fL (ref 80.0–100.0)
Platelets: 140 10*3/uL — ABNORMAL LOW (ref 150–400)
RBC: 4.09 MIL/uL (ref 3.87–5.11)
RDW: 12.8 % (ref 11.5–15.5)
WBC: 4.1 10*3/uL (ref 4.0–10.5)
nRBC: 0 % (ref 0.0–0.2)

## 2021-12-10 LAB — COMPREHENSIVE METABOLIC PANEL
ALT: 12 U/L (ref 0–44)
AST: 14 U/L — ABNORMAL LOW (ref 15–41)
Albumin: 2.7 g/dL — ABNORMAL LOW (ref 3.5–5.0)
Alkaline Phosphatase: 69 U/L (ref 38–126)
Anion gap: 6 (ref 5–15)
BUN: 5 mg/dL — ABNORMAL LOW (ref 6–20)
CO2: 25 mmol/L (ref 22–32)
Calcium: 6.3 mg/dL — CL (ref 8.9–10.3)
Chloride: 107 mmol/L (ref 98–111)
Creatinine, Ser: 0.64 mg/dL (ref 0.44–1.00)
GFR, Estimated: >60 mL/min (ref >60)
Glucose, Bld: 101 mg/dL — ABNORMAL HIGH (ref 70–99)
Potassium: 2.9 mmol/L — ABNORMAL LOW (ref 3.5–5.1)
Sodium: 138 mmol/L (ref 135–145)
Total Bilirubin: 0.8 mg/dL (ref 0.3–1.2)
Total Protein: 5.1 g/dL — ABNORMAL LOW (ref 6.5–8.1)

## 2021-12-10 LAB — MAGNESIUM: Magnesium: 5.6 mg/dL — ABNORMAL HIGH (ref 1.7–2.4)

## 2021-12-10 MED ORDER — NIFEDIPINE ER OSMOTIC RELEASE 60 MG PO TB24: 60.0000 mg | ORAL_TABLET | Freq: Every day | ORAL | Status: DC

## 2021-12-10 MED ORDER — CALCIUM CITRATE 950 (200 CA) MG PO TABS
200.0000 mg | ORAL_TABLET | Freq: Two times a day (BID) | ORAL | Status: DC
  Administered 2021-12-10: 10:00:00 200 mg via ORAL
  Filled 2021-12-10 (×2): qty 1

## 2021-12-10 MED ORDER — POTASSIUM CHLORIDE 20 MEQ PO PACK
20.0000 meq | PACK | Freq: Two times a day (BID) | ORAL | Status: DC
  Administered 2021-12-10: 10:00:00 20 meq via ORAL
  Filled 2021-12-10 (×2): qty 1

## 2021-12-10 MED ORDER — NIFEDIPINE ER 60 MG PO TB24: 60.0000 mg | ORAL_TABLET | Freq: Every day | ORAL | 1 refills | Status: DC

## 2021-12-10 NOTE — Discharge Instructions (Signed)
Call for sorsening headache, vision change, nausea/vomiting, upper abdominal pain

## 2021-12-10 NOTE — Progress Notes (Signed)
Pt given discharge instructions and all questions answered. Pt verbalized understanding. Pt discharged home in stable condition with all belongings.

## 2021-12-10 NOTE — Progress Notes (Signed)
HD #2, PPD #14, preeclampsia Feeling much better, headache is gone Afeb, VSS, BP normal  Magnesium has been off for several hours, feeling ok, BP normal.  Will d/c home on procardia, fu/ next week

## 2021-12-10 NOTE — Progress Notes (Addendum)
Date and time results received: 12/10/21 0522  Test: calcium Critical Value: calcium level of 6.3  Name of Provider Notified: Dr. Henderson Cloud  Orders Received? Or Actions Taken?: Dr. Henderson Cloud to put in orders

## 2021-12-10 NOTE — Progress Notes (Signed)
Pt was a little groggy and sluggish. Labs done with magnesium.  Vitals:   12/10/21 0139 12/10/21 0248 12/10/21 0347 12/10/21 0445  BP:   103/63   Pulse:   88   Resp: 18 17 18 16   Temp:   98.6 F (37 C)   TempSrc:   Oral   SpO2:   98%   Weight:      Height:        Results for orders placed or performed during the hospital encounter of 12/09/21 (from the past 24 hour(s))  Comprehensive metabolic panel     Status: Abnormal   Collection Time: 12/09/21 11:58 AM  Result Value Ref Range   Sodium 140 135 - 145 mmol/L   Potassium 3.6 3.5 - 5.1 mmol/L   Chloride 109 98 - 111 mmol/L   CO2 24 22 - 32 mmol/L   Glucose, Bld 89 70 - 99 mg/dL   BUN 10 6 - 20 mg/dL   Creatinine, Ser 12/11/21 0.44 - 1.00 mg/dL   Calcium 8.4 (L) 8.9 - 10.3 mg/dL   Total Protein 6.2 (L) 6.5 - 8.1 g/dL   Albumin 3.3 (L) 3.5 - 5.0 g/dL   AST 15 15 - 41 U/L   ALT 13 0 - 44 U/L   Alkaline Phosphatase 70 38 - 126 U/L   Total Bilirubin 0.6 0.3 - 1.2 mg/dL   GFR, Estimated 9.29 >24 mL/min   Anion gap 7 5 - 15  CBC with Differential     Status: Abnormal   Collection Time: 12/09/21 11:58 AM  Result Value Ref Range   WBC 3.6 (L) 4.0 - 10.5 K/uL   RBC 4.03 3.87 - 5.11 MIL/uL   Hemoglobin 12.8 12.0 - 15.0 g/dL   HCT 12/11/21 28.6 - 38.1 %   MCV 96.3 80.0 - 100.0 fL   MCH 31.8 26.0 - 34.0 pg   MCHC 33.0 30.0 - 36.0 g/dL   RDW 77.1 16.5 - 79.0 %   Platelets 133 (L) 150 - 400 K/uL   nRBC 0.0 0.0 - 0.2 %   Neutrophils Relative % 45 %   Neutro Abs 1.7 1.7 - 7.7 K/uL   Lymphocytes Relative 43 %   Lymphs Abs 1.5 0.7 - 4.0 K/uL   Monocytes Relative 8 %   Monocytes Absolute 0.3 0.1 - 1.0 K/uL   Eosinophils Relative 3 %   Eosinophils Absolute 0.1 0.0 - 0.5 K/uL   Basophils Relative 1 %   Basophils Absolute 0.0 0.0 - 0.1 K/uL   Immature Granulocytes 0 %   Abs Immature Granulocytes 0.01 0.00 - 0.07 K/uL   Reactive, Benign Lymphocytes PRESENT   Resp Panel by RT-PCR (Flu A&B, Covid) Nasopharyngeal Swab     Status: Abnormal    Collection Time: 12/09/21 12:41 PM   Specimen: Nasopharyngeal Swab; Nasopharyngeal(NP) swabs in vial transport medium  Result Value Ref Range   SARS Coronavirus 2 by RT PCR POSITIVE (A) NEGATIVE   Influenza A by PCR NEGATIVE NEGATIVE   Influenza B by PCR NEGATIVE NEGATIVE  Urinalysis, Routine w reflex microscopic     Status: Abnormal   Collection Time: 12/09/21  3:40 PM  Result Value Ref Range   Color, Urine RED (A) YELLOW   APPearance TURBID (A) CLEAR   Specific Gravity, Urine >1.030 (H) 1.005 - 1.030   pH 6.5 5.0 - 8.0   Glucose, UA 100 (A) NEGATIVE mg/dL   Hgb urine dipstick LARGE (A) NEGATIVE   Bilirubin Urine SMALL (A) NEGATIVE  Ketones, ur NEGATIVE NEGATIVE mg/dL   Protein, ur >948 (A) NEGATIVE mg/dL   Nitrite POSITIVE (A) NEGATIVE   Leukocytes,Ua TRACE (A) NEGATIVE  Urinalysis, Microscopic (reflex)     Status: Abnormal   Collection Time: 12/09/21  3:40 PM  Result Value Ref Range   RBC / HPF >50 0 - 5 RBC/hpf   WBC, UA 6-10 0 - 5 WBC/hpf   Bacteria, UA RARE (A) NONE SEEN   Squamous Epithelial / LPF NONE SEEN 0 - 5  CBC     Status: Abnormal   Collection Time: 12/10/21  4:14 AM  Result Value Ref Range   WBC 4.1 4.0 - 10.5 K/uL   RBC 4.09 3.87 - 5.11 MIL/uL   Hemoglobin 12.9 12.0 - 15.0 g/dL   HCT 01.6 55.3 - 74.8 %   MCV 93.9 80.0 - 100.0 fL   MCH 31.5 26.0 - 34.0 pg   MCHC 33.6 30.0 - 36.0 g/dL   RDW 27.0 78.6 - 75.4 %   Platelets 140 (L) 150 - 400 K/uL   nRBC 0.0 0.0 - 0.2 %  Comprehensive metabolic panel     Status: Abnormal   Collection Time: 12/10/21  4:14 AM  Result Value Ref Range   Sodium 138 135 - 145 mmol/L   Potassium 2.9 (L) 3.5 - 5.1 mmol/L   Chloride 107 98 - 111 mmol/L   CO2 25 22 - 32 mmol/L   Glucose, Bld 101 (H) 70 - 99 mg/dL   BUN 5 (L) 6 - 20 mg/dL   Creatinine, Ser 4.92 0.44 - 1.00 mg/dL   Calcium 6.3 (LL) 8.9 - 10.3 mg/dL   Total Protein 5.1 (L) 6.5 - 8.1 g/dL   Albumin 2.7 (L) 3.5 - 5.0 g/dL   AST 14 (L) 15 - 41 U/L   ALT 12 0 - 44 U/L    Alkaline Phosphatase 69 38 - 126 U/L   Total Bilirubin 0.8 0.3 - 1.2 mg/dL   GFR, Estimated >01 >00 mL/min   Anion gap 6 5 - 15  Magnesium     Status: Abnormal   Collection Time: 12/10/21  4:14 AM  Result Value Ref Range   Magnesium 5.6 (H) 1.7 - 2.4 mg/dL     Pt's magnesium and Cr are normal but now her potassium is low at 2.9 and Ca at 6.3.  Will replete.

## 2021-12-17 NOTE — Discharge Summary (Signed)
Physician Discharge Summary  Patient ID: DANDREA MEDDERS MRN: [id number] DOB/AGE: [birth year] 35 y.o.  Admit date: 12/09/2021 Discharge date: 12/10/2021  Admission Diagnoses:  postpartum preeclampsia  Discharge Diagnoses: same Principal Problem:   Postpartum hypertension Active Problems:   Preeclampsia in postpartum period   Discharged Condition: good  Hospital Course: Pt presented to ED 12 days postpartum with headache and elevated BP, normal labs.  She was admitted by Dr. Henderson Cloud and started on magnesium sulfate for seizure prophylaxis and procardia to control BP.  Magnesium was d/c after 24 hrs, she felt better, BP controlled/normal, stable for discharge home   Discharge Exam: Blood pressure 116/72, pulse 88, temperature 97.8 F (36.6 C), temperature source Oral, resp. rate 18, height 5\' 6"  (1.676 m), weight 85.3 kg, SpO2 98 %, not currently breastfeeding. General appearance: alert  Disposition: Discharge disposition: 01-Home or Self Care       Discharge Instructions     Diet - low sodium heart healthy   Complete by: As directed       Allergies as of 12/10/2021   No Known Allergies      Medication List     TAKE these medications    acetaminophen 325 MG tablet Commonly known as: Tylenol Take 2 tablets (650 mg total) by mouth every 6 (six) hours as needed (for pain scale < 4). What changed: reasons to take this   aspirin-acetaminophen-caffeine 250-250-65 MG tablet Commonly known as: EXCEDRIN MIGRAINE Take 2 tablets by mouth every 6 (six) hours as needed for headache.   ibuprofen 200 MG tablet Commonly known as: ADVIL Take 3 tablets (600 mg total) by mouth every 6 (six) hours as needed.   NIFEdipine 60 MG 24 hr tablet Commonly known as: ADALAT CC Take 1 tablet (60 mg total) by mouth daily.        Follow-up Information     01-11-1989, MD. Schedule an appointment as soon as possible for a visit.   Specialty: Obstetrics and Gynecology Why:  for BP check Contact information: [address] [place] [phone]                 Signed: [phone] Elasha Tess 12/17/2021, 12:42 PM

## 2022-05-09 ENCOUNTER — Telehealth: Payer: Self-pay

## 2022-05-09 NOTE — Telephone Encounter (Signed)
NOTES SCANNED TO REFERRAL 

## 2022-06-02 NOTE — Progress Notes (Signed)
Cardiology Office Note   Date:  06/03/2022   ID:  Joanna Mcpherson, DOB March 28, 1986, MRN [id number]  PCP:  Waynard Reeds, MD  Cardiologist:   None Referring:  Waynard Reeds, MD  Chief Complaint  Patient presents with   Hypertension      History of Present Illness: Joanna Mcpherson is a 36 y.o. female who presents for evaluation of difficult to control HTN.  She has a history of preeclampsia.  She was hospitalized for this in December.  She said that this was her fourth child.  She is never really had trouble and was not hypertensive before this pregnancy.  She was found to have hypertension when she presented with significant headaches.  She was treated with Procardia but actually has not been on this having had some trouble getting the prescription.  She says her blood pressure fluctuates.  Is been 185/100 and 1 in the morning recently.  It does go down sometimes in the afternoon.  She does describe palpitations episodically.  She says this seems to be happening more frequently and she thought it might have been associated with the Procardia.  She feels palpitations and hot flashes.  She has not had any new presyncope or syncope.  She has not had any new chest pressure, neck or arm discomfort.  She goes up and down stairs.  She takes care of her children and also has a job.  She stopped drinking caffeine.  She has no shortness of breath, PND or orthopnea.  She is not having chest pain.   Past Medical History:  Diagnosis Date   Abnormal Pap smear    Asthma    no issue in 30 yrs   Headache(784.0)    Hx of migraines last may 2021   Miscarriage 04/2020    Past Surgical History:  Procedure Laterality Date   ANKLE FRACTURE SURGERY Left 14 yrs ago   CERVICAL CONE BIOPSY     DILATION AND EVACUATION N/A 10/18/2020   Procedure: DILATATION AND EVACUATION;  Surgeon: Charlett Nose, MD;  Location: [facility] OR;  Service: Gynecology;  Laterality: N/A;   elective abortion     HYSTEROSCOPY  WITH D & C N/A 06/19/2020   Procedure: DILATATION AND CURETTAGE /HYSTEROSCOPY WITH HYSTEROSCOPIC MYOMECTOMY;  Surgeon: Waynard Reeds, MD;  Location: [facility];  Service: Gynecology;  Laterality: N/A;     Current Outpatient Medications  Medication Sig Dispense Refill   aspirin-acetaminophen-caffeine (EXCEDRIN MIGRAINE) 250-250-65 MG tablet Take 2 tablets by mouth every 6 (six) hours as needed for headache.     CRYSELLE-28 0.3-30 MG-MCG tablet Take 1 tablet by mouth daily.     spironolactone (ALDACTONE) 50 MG tablet Take 1 tablet (50 mg total) by mouth daily. 90 tablet 3   acetaminophen (TYLENOL) 325 MG tablet Take 2 tablets (650 mg total) by mouth every 6 (six) hours as needed (for pain scale < 4). (Patient taking differently: Take 650 mg by mouth every 6 (six) hours as needed for mild pain.)     ibuprofen (ADVIL) 200 MG tablet Take 3 tablets (600 mg total) by mouth every 6 (six) hours as needed. (Patient not taking: Reported on 12/09/2021)     NIFEdipine (ADALAT CC) 60 MG 24 hr tablet Take 1 tablet (60 mg total) by mouth daily. (Patient not taking: Reported on 06/03/2022) 30 tablet 1   NIFEdipine (PROCARDIA XL/NIFEDICAL-XL) 90 MG 24 hr tablet Take 90 mg by mouth daily. (Patient not taking: Reported on 06/03/2022)  No current facility-administered medications for this visit.    Allergies:   Patient has no known allergies.    Social History:  The patient  reports that she quit smoking about 4 years ago. Her smoking use included cigarettes. She has a 3.00 pack-year smoking history. She has never used smokeless tobacco. She reports that she does not currently use alcohol. She reports that she does not use drugs.   Family History:  The patient's family history includes Diabetes in her mother; Heart disease in her mother.    ROS:  Please see the history of present illness.   Otherwise, review of systems are positive for none.   All other systems are reviewed and negative.     PHYSICAL EXAM: VS:  BP (!) 156/94 (BP Location: Left Arm, Patient Position: Sitting, Cuff Size: Large)   Pulse 76   Ht 5\' 6"  (1.676 m)   Wt 182 lb 3.2 oz (82.6 kg)   SpO2 99%   BMI 29.41 kg/m  , BMI Body mass index is 29.41 kg/m. GENERAL:  Well appearing HEENT:  Pupils equal round and reactive, fundi not visualized, oral mucosa unremarkable NECK:  No jugular venous distention, waveform within normal limits, carotid upstroke brisk and symmetric, no bruits, no thyromegaly LYMPHATICS:  No cervical, inguinal adenopathy LUNGS:  Clear to auscultation bilaterally BACK:  No CVA tenderness CHEST:  Unremarkable HEART:  PMI not displaced or sustained,S1 and S2 within normal limits, no S3, no S4, no clicks, no rubs, no murmurs ABD:  Flat, positive bowel sounds normal in frequency in pitch, no bruits, no rebound, no guarding, no midline pulsatile mass, no hepatomegaly, no splenomegaly EXT:  2 plus pulses throughout, no edema, no cyanosis no clubbing SKIN:  No rashes no nodules NEURO:  Cranial nerves II through XII grossly intact, motor grossly intact throughout PSYCH:  Cognitively intact, oriented to person place and time    EKG:  EKG is ordered today. The ekg ordered today demonstrates sinus rhythm, rate 76, axis within normal limits, intervals within normal limits, no acute ST-T wave changes.   Recent Labs: 12/10/2021: ALT 12; BUN 5; Creatinine, Ser 0.64; Hemoglobin 12.9; Magnesium 5.6; Platelets 140; Potassium 2.9; Sodium 138    Lipid Panel No results found for: "CHOL", "TRIG", "HDL", "CHOLHDL", "VLDL", "LDLCALC", "LDLDIRECT"    Wt Readings from Last 3 Encounters:  06/03/22 182 lb 3.2 oz (82.6 kg)  12/09/21 188 lb (85.3 kg)  11/26/21 196 lb (88.9 kg)      Other studies Reviewed: Additional studies/ records that were reviewed today include: Hospital records. Review of the above records demonstrates:  Please see elsewhere in the note.     ASSESSMENT AND  PLAN:  Hypertension: She has had hypertensive urgency.  This is new onset hypertension.  I am going to start spironolactone.  I will check a basic metabolic profile and TSH today.  I do note that the UA did not suggest a secondary etiology previously.  I am going to check a 24-hour urine for VMA and metanephrines.  I will check renal artery Dopplers for stenosis.  We talked about therapeutic lifestyle changes to include increased physical activity and salt restriction.  She is going to keep a blood pressure diary.   Current medicines are reviewed at length with the patient today.  The patient does not have concerns regarding medicines.  The following changes have been made:  no change  Labs/ tests ordered today include:   Orders Placed This Encounter  Procedures  Metanephrines, Urine, 24 hour   Basic Metabolic Panel (BMET)   TSH   Catecholamine+VMA, 24-Hr Urine   Basic Metabolic Panel (BMET)   EKG 12-Lead   VAS US RENAL ARTERY DUPLEX     Disposition:   FU with [facility]    Signed, Rollene Rotunda, MD  06/03/2022 9:08 AM     [facility]

## 2022-06-03 ENCOUNTER — Ambulatory Visit (INDEPENDENT_AMBULATORY_CARE_PROVIDER_SITE_OTHER): Payer: Medicaid Other | Admitting: Cardiology

## 2022-06-03 ENCOUNTER — Encounter: Payer: Self-pay | Admitting: Cardiology

## 2022-06-03 VITALS — BP 156/94 | HR 76 | Ht 66.0 in | Wt 182.2 lb

## 2022-06-03 DIAGNOSIS — I160 Hypertensive urgency: Secondary | ICD-10-CM

## 2022-06-03 DIAGNOSIS — I10 Essential (primary) hypertension: Secondary | ICD-10-CM | POA: Diagnosis not present

## 2022-06-03 LAB — BASIC METABOLIC PANEL
BUN/Creatinine Ratio: 14 (ref 9–23)
BUN: 10 mg/dL (ref 6–20)
CO2: 23 mmol/L (ref 20–29)
Calcium: 9.3 mg/dL (ref 8.7–10.2)
Chloride: 107 mmol/L — ABNORMAL HIGH (ref 96–106)
Creatinine, Ser: 0.72 mg/dL (ref 0.57–1.00)
Glucose: 84 mg/dL (ref 70–99)
Potassium: 4.7 mmol/L (ref 3.5–5.2)
Sodium: 140 mmol/L (ref 134–144)
eGFR: 112 mL/min/{1.73_m2} (ref >59)

## 2022-06-03 LAB — TSH: TSH: 0.602 u[IU]/mL (ref 0.450–4.500)

## 2022-06-03 MED ORDER — SPIRONOLACTONE 50 MG PO TABS: 50.0000 mg | ORAL_TABLET | Freq: Every day | ORAL | 3 refills | Status: DC

## 2022-06-03 NOTE — Patient Instructions (Signed)
Medication Instructions:   START SPIRONOLACTONE 50 MG ONCE DAILY  *If you need a refill on your cardiac medications before your next appointment, please call your pharmacy*   Lab Work:  Your physician recommends that you return for lab work in: ONE WEEK- DO NOT NEED TO FAST  If you have labs (blood work) drawn today and your tests are completely normal, you will receive your results only by: MyChart Message (if you have MyChart) OR A paper copy in the mail If you have any lab test that is abnormal or we need to change your treatment, we will call you to review the results.   Testing/Procedures:  Your physician has requested that you have a renal artery duplex. During this test, an ultrasound is used to evaluate blood flow to the kidneys. Allow one hour for this exam. Do not eat after midnight the day before and avoid carbonated beverages. Take your medications as you usually do. [facility]   Follow-Up: At [facility], you and your health needs are our priority.  As part of our continuing mission to provide you with exceptional heart care, we have created designated Provider Care Teams.  These Care Teams include your primary Cardiologist (physician) and Advanced Practice Providers (APPs -  Physician Assistants and Nurse Practitioners) who all work together to provide you with the care you need, when you need it.  We recommend signing up for the patient portal called "MyChart".  Sign up information is provided on this After Visit Summary.  MyChart is used to connect with patients for Virtual Visits (Telemedicine).  Patients are able to view lab/test results, encounter notes, upcoming appointments, etc.  Non-urgent messages can be sent to your provider as well.   To learn more about what you can do with MyChart, go to [email or web address].    Your next appointment:   4 week(s)  The format for your next appointment:   In Person  Provider:   PHARM D IN THE HTN CLINIC     Your physician recommends that you schedule a follow-up appointment in: 2 MONTHS WITH DR [facility]   Important Information About Sugar

## 2022-06-09 LAB — CATECHOLAMINE+VMA, 24-HR URINE
VMA, 24H Ur Adult: 3.6 mg/24 hr (ref 0.0–7.5)
VMA, Urine: 2.4 mg/L

## 2022-06-09 LAB — METANEPHRINES, URINE, 24 HOUR
Metaneph Total, Ur: 63 ug/L
Metanephrines, 24H Ur: 95 ug/24 hr (ref 36–209)
Normetanephrine, 24H Ur: 158 ug/24 hr (ref 131–612)
Normetanephrine, Ur: 105 ug/L

## 2022-06-10 ENCOUNTER — Ambulatory Visit (HOSPITAL_COMMUNITY)
Admission: RE | Admit: 2022-06-10 | Discharge: 2022-06-10 | Disposition: A | Discharge status: Home / Self Care | Payer: Medicaid Other | Source: Ambulatory Visit | Attending: Internal Medicine | Admitting: Internal Medicine

## 2022-06-10 DIAGNOSIS — I10 Essential (primary) hypertension: Secondary | ICD-10-CM | POA: Insufficient documentation

## 2022-06-28 ENCOUNTER — Encounter: Payer: Self-pay | Admitting: Pharmacist

## 2022-06-28 ENCOUNTER — Ambulatory Visit (INDEPENDENT_AMBULATORY_CARE_PROVIDER_SITE_OTHER): Payer: Medicaid Other | Admitting: Pharmacist

## 2022-06-28 ENCOUNTER — Other Ambulatory Visit: Payer: Self-pay

## 2022-06-28 VITALS — BP 120/79 | HR 78 | Ht 66.0 in | Wt 176.0 lb

## 2022-06-28 DIAGNOSIS — I10 Essential (primary) hypertension: Secondary | ICD-10-CM
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
## 2022-06-28 NOTE — Patient Instructions (Addendum)
It was nice meeting you today  We would like to keep your blood pressure less than 130/80  Continue your spironolactone 50mg  once a day  We will recheck your lab work today to make sure there are no issues  Please continue checking your blood pressure at home and let know if it begins to trend up  Please call or message with any questions or concerns  [place], PharmD, BCACP, CDCES, CPP [address] [place], [place], [place] Phone: [phone], Fax: [phone]

## 2022-06-29 LAB — BASIC METABOLIC PANEL
BUN/Creatinine Ratio: 12 (ref 9–23)
BUN: 9 mg/dL (ref 6–20)
CO2: 23 mmol/L (ref 20–29)
Calcium: 9.5 mg/dL (ref 8.7–10.2)
Chloride: 101 mmol/L (ref 96–106)
Creatinine, Ser: 0.73 mg/dL (ref 0.57–1.00)
Glucose: 76 mg/dL (ref 70–99)
Potassium: 5.1 mmol/L (ref 3.5–5.2)
Sodium: 139 mmol/L (ref 134–144)
eGFR: 111 mL/min/{1.73_m2} (ref >59)

## 2022-07-06 ENCOUNTER — Other Ambulatory Visit: Payer: Self-pay

## 2022-07-06 ENCOUNTER — Emergency Department (HOSPITAL_BASED_OUTPATIENT_CLINIC_OR_DEPARTMENT_OTHER)
Admission: EM | Admit: 2022-07-06 | Discharge: 2022-07-06 | Disposition: A | Discharge status: Home / Self Care | Payer: Medicaid Other | Attending: Emergency Medicine | Admitting: Emergency Medicine

## 2022-07-06 ENCOUNTER — Encounter (HOSPITAL_BASED_OUTPATIENT_CLINIC_OR_DEPARTMENT_OTHER): Payer: Self-pay | Admitting: Emergency Medicine

## 2022-07-06 DIAGNOSIS — Z7982 Long term (current) use of aspirin: Secondary | ICD-10-CM | POA: Diagnosis not present

## 2022-07-06 DIAGNOSIS — M62830 Muscle spasm of back: Secondary | ICD-10-CM | POA: Diagnosis not present

## 2022-07-06 DIAGNOSIS — M546 Pain in thoracic spine: Secondary | ICD-10-CM | POA: Diagnosis present

## 2022-07-06 MED ORDER — METHOCARBAMOL 500 MG PO TABS: 500.0000 mg | ORAL_TABLET | Freq: Two times a day (BID) | ORAL | 0 refills | Status: DC

## 2022-07-06 MED ORDER — LIDOCAINE 5 % EX PTCH
1.0000 | MEDICATED_PATCH | CUTANEOUS | Status: DC
  Administered 2022-07-06: 1 via TRANSDERMAL
  Filled 2022-07-06: qty 1

## 2022-07-06 MED ORDER — KETOROLAC TROMETHAMINE 10 MG PO TABS: 10.0000 mg | ORAL_TABLET | Freq: Four times a day (QID) | ORAL | 0 refills | Status: DC | PRN

## 2022-07-06 MED ORDER — KETOROLAC TROMETHAMINE 30 MG/ML IJ SOLN
30.0000 mg | Freq: Once | INTRAMUSCULAR | Status: AC
Start: 2022-07-06 — End: 2022-07-06
  Administered 2022-07-06: 30 mg via INTRAMUSCULAR
  Filled 2022-07-06: qty 1

## 2022-07-06 NOTE — Discharge Instructions (Addendum)
Your symptoms appear to be related to what we call an overuse injury which can cause muscle straining and spasms of the back.  I have sent you in a muscle relaxer that you can take to help with symptoms-please take this at night at first to see how drowsy it makes you feel.  Have also sent you in an anti-inflammatory medication.  If you choose to take this, please do not take additional NSAIDs at the same time such as ibuprofen or Aleve.  You can also pick up over-the-counter lidocaine patches called Salonpas which may provide some relief in discomfort.  I would use heating pads over the area which may loosen the stiff muscles.  Please remember to get up and stretch and walk around for at least 10 to 15 minutes several times a day which will help speed up healing.

## 2022-07-06 NOTE — ED Triage Notes (Signed)
Pt reports middle back pain since yesterday, worsening today

## 2022-07-06 NOTE — ED Provider Notes (Signed)
[facility] EMERGENCY DEPARTMENT Provider Note   CSN: [id number] Arrival date & time: 07/06/22  2037     History  Chief Complaint  Patient presents with   Back Pain    Joanna Mcpherson is a 36 y.o. female who presents emergency department for evaluation of left middle back pain that has been slowly developing over a couple days and worsened significantly yesterday and into today.  Patient has a baby at home and states that she has been doing a lot of bending and lifting.  Today she is having trouble walking and is unable to carry her baby due to the degree of pain.  Pain worse with movement, deep breathing and coughing.  No treatment prior to arrival.  She denies saddle paresthesia, leg weakness, chest pain,.   Back Pain Associated symptoms: no abdominal pain, no chest pain and no numbness        Home Medications Prior to Admission medications   Medication Sig Start Date End Date Taking? Authorizing Provider  ketorolac (TORADOL) 10 MG tablet Take 1 tablet (10 mg total) by mouth every 6 (six) hours as needed. 07/06/22  Yes Raynald Blend R, PA-C  methocarbamol (ROBAXIN) 500 MG tablet Take 1 tablet (500 mg total) by mouth 2 (two) times daily. 07/06/22  Yes Janell Quiet, PA-C  acetaminophen (TYLENOL) 325 MG tablet Take 2 tablets (650 mg total) by mouth every 6 (six) hours as needed (for pain scale < 4). Patient taking differently: Take 650 mg by mouth every 6 (six) hours as needed for mild pain. 11/28/21   Charlett Nose, MD  aspirin-acetaminophen-caffeine (EXCEDRIN MIGRAINE) [phone] MG tablet Take 2 tablets by mouth every 6 (six) hours as needed for headache.    [provider]  CRYSELLE-28 0.3-30 MG-MCG tablet Take 1 tablet by mouth daily. 03/31/22   [provider]  spironolactone (ALDACTONE) 50 MG tablet Take 1 tablet (50 mg total) by mouth daily. 06/03/22   Rollene Rotunda, MD      Allergies    Patient has no known allergies.    Review of  Systems   Review of Systems  Respiratory:  Negative for shortness of breath.   Cardiovascular:  Negative for chest pain.  Gastrointestinal:  Negative for abdominal pain.  Genitourinary:  Negative for difficulty urinating.  Musculoskeletal:  Positive for back pain.  Neurological:  Negative for numbness.    Physical Exam Updated Vital Signs BP 125/85 (BP Location: Right Arm)   Pulse 75   Temp 98.4 F (36.9 C) (Oral)   Resp 17   Ht 5\' 6"  (1.676 m)   Wt 80.3 kg   LMP 06/22/2022   SpO2 99%   BMI 28.57 kg/m  Physical Exam Vitals and nursing note reviewed.  Constitutional:      General: She is not in acute distress.    Appearance: She is not ill-appearing.  HENT:     Head: Atraumatic.  Eyes:     Conjunctiva/sclera: Conjunctivae normal.  Cardiovascular:     Rate and Rhythm: Normal rate and regular rhythm.     Pulses: Normal pulses.     Heart sounds: No murmur heard. Pulmonary:     Effort: Pulmonary effort is normal. No respiratory distress.     Breath sounds: Normal breath sounds.  Abdominal:     General: Abdomen is flat. There is no distension.     Palpations: Abdomen is soft.     Tenderness: There is no abdominal tenderness.  Musculoskeletal:  General: Normal range of motion.       Arms:     Cervical back: Normal range of motion.     Comments: TTP left thoracic musculature. No midline tenderness  Skin:    General: Skin is warm and dry.     Capillary Refill: Capillary refill takes less than 2 seconds.  Neurological:     General: No focal deficit present.     Mental Status: She is alert.  Psychiatric:        Mood and Affect: Mood normal.     ED Results / Procedures / Treatments   Labs (all labs ordered are listed, but only abnormal results are displayed) Labs Reviewed - No data to display  EKG None  Radiology No results found.  Procedures Procedures    Medications Ordered in ED Medications  lidocaine (LIDODERM) 5 % 1 patch (1 patch  Transdermal Patch Applied 07/06/22 2120)  ketorolac (TORADOL) 30 MG/ML injection 30 mg (30 mg Intramuscular Given 07/06/22 2118)    ED Course/ Medical Decision Making/ A&P                           Medical Decision Making Risk Prescription drug management.   Patient presents to the ED for evaluation of left-sided mid back pain.  Differentials include compression fracture, herniated disc, muscular strain, muscular spasm.  Vitals without significant abnormality.  On exam, she is tender to palpation of the thoracic musculature.  No midline tenderness.  I considered obtaining chest x-ray, however given her lack of acute trauma and no midline tenderness I do not think that this was indicated.  Pain appears muscular in nature.  She was given Lidoderm patch and Toradol injection.  Discharged home with Robaxin and Toradol p.o.  Advised to pick up Salonpas if she finds relief with the Lidoderm patch.  Recommend stretching and movement to facilitate faster healing.  Patient expresses understanding and is amenable to plan.  Discharged home in good condition.  Final Clinical Impression(s) / ED Diagnoses Final diagnoses:  Muscle spasm of back    Rx / DC Orders ED Discharge Orders          Ordered    ketorolac (TORADOL) 10 MG tablet  Every 6 hours PRN        07/06/22 2110    methocarbamol (ROBAXIN) 500 MG tablet  2 times daily        07/06/22 2110              Janell Quiet, PA-C 07/06/22 2201    Virgina Norfolk, DO 07/06/22 2209

## 2022-08-02 ENCOUNTER — Emergency Department (HOSPITAL_BASED_OUTPATIENT_CLINIC_OR_DEPARTMENT_OTHER)
Admission: EM | Admit: 2022-08-02 | Discharge: 2022-08-02 | Disposition: A | Discharge status: Home / Self Care | Payer: Medicaid Other | Attending: Emergency Medicine | Admitting: Emergency Medicine

## 2022-08-02 ENCOUNTER — Other Ambulatory Visit: Payer: Self-pay

## 2022-08-02 ENCOUNTER — Encounter (HOSPITAL_BASED_OUTPATIENT_CLINIC_OR_DEPARTMENT_OTHER): Payer: Self-pay | Admitting: Emergency Medicine

## 2022-08-02 ENCOUNTER — Emergency Department (HOSPITAL_BASED_OUTPATIENT_CLINIC_OR_DEPARTMENT_OTHER): Payer: Medicaid Other

## 2022-08-02 DIAGNOSIS — G44319 Acute post-traumatic headache, not intractable: Secondary | ICD-10-CM | POA: Diagnosis not present

## 2022-08-02 DIAGNOSIS — Z7982 Long term (current) use of aspirin: Secondary | ICD-10-CM | POA: Diagnosis not present

## 2022-08-02 DIAGNOSIS — Y92410 Unspecified street and highway as the place of occurrence of the external cause: Secondary | ICD-10-CM | POA: Diagnosis not present

## 2022-08-02 DIAGNOSIS — R519 Headache, unspecified: Secondary | ICD-10-CM | POA: Diagnosis present

## 2022-08-02 MED ORDER — ACETAMINOPHEN 325 MG PO TABS
650.0000 mg | ORAL_TABLET | Freq: Once | ORAL | Status: AC
  Administered 2022-08-02: 650 mg via ORAL
  Filled 2022-08-02: qty 2

## 2022-08-02 MED ORDER — ONDANSETRON 4 MG PO TBDP: 4.0000 mg | ORAL_TABLET | Freq: Three times a day (TID) | ORAL | 0 refills | Status: DC | PRN

## 2022-08-02 MED ORDER — METHOCARBAMOL 500 MG PO TABS
500.0000 mg | ORAL_TABLET | Freq: Two times a day (BID) | ORAL | 0 refills | Status: AC
Start: 2022-08-02 — End: ?

## 2022-08-02 MED ORDER — NAPROXEN 500 MG PO TABS: 500.0000 mg | ORAL_TABLET | Freq: Two times a day (BID) | ORAL | 0 refills | Status: AC
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
## 2022-08-06 DIAGNOSIS — I160 Hypertensive urgency: Secondary | ICD-10-CM | POA: Insufficient documentation

## 2022-08-06 NOTE — Progress Notes (Deleted)
Cardiology Office Note   Date:  08/06/2022   ID:  Joanna Mcpherson, DOB 26-Jan-1986, MRN [id number]  PCP:  Vanessa Kick, MD  Cardiologist:   None Referring:  Vanessa Kick, MD  No chief complaint on file.     History of Present Illness: Joanna Mcpherson is a 36 y.o. female who presents for evaluation of difficult to control HTN.  ***      ***She has a history of preeclampsia.  She was hospitalized for this in December.  She said that this was her fourth child.  She is never really had trouble and was not hypertensive before this pregnancy.  She was found to have hypertension when she presented with significant headaches.  She was treated with Procardia but actually has not been on this having had some trouble getting the prescription.  She says her blood pressure fluctuates.  Is been 185/100 and 1 in the morning recently.  It does go down sometimes in the afternoon.  She does describe palpitations episodically.  She says this seems to be happening more frequently and she thought it might have been associated with the Procardia.  She feels palpitations and hot flashes.  She has not had any new presyncope or syncope.  She has not had any new chest pressure, neck or arm discomfort.  She goes up and down stairs.  She takes care of her children and also has a job.  She stopped drinking caffeine.  She has no shortness of breath, PND or orthopnea.  She is not having chest pain.   Past Medical History:  Diagnosis Date   Abnormal Pap smear    Asthma    no issue in 30 yrs   Headache(784.0)    Hx of migraines last may 2021   Miscarriage 04/2020    Past Surgical History:  Procedure Laterality Date   ANKLE FRACTURE SURGERY Left 14 yrs ago   [place] N/A 10/18/2020   Procedure: DILATATION AND EVACUATION;  Surgeon: Rowland Lathe, MD;  Location: [facility];  Service: Gynecology;  Laterality: N/A;   elective abortion     HYSTEROSCOPY WITH D & C N/A  06/19/2020   Procedure: DILATATION AND CURETTAGE /HYSTEROSCOPY WITH HYSTEROSCOPIC MYOMECTOMY;  Surgeon: Vanessa Kick, MD;  Location: [facility];  Service: Gynecology;  Laterality: N/A;     Current Outpatient Medications  Medication Sig Dispense Refill   acetaminophen (TYLENOL) 325 MG tablet Take 2 tablets (650 mg total) by mouth every 6 (six) hours as needed (for pain scale < 4). (Patient taking differently: Take 650 mg by mouth every 6 (six) hours as needed for mild pain.)     aspirin-acetaminophen-caffeine (EXCEDRIN MIGRAINE) 250-250-65 MG tablet Take 2 tablets by mouth every 6 (six) hours as needed for headache.     CRYSELLE-28 0.3-30 MG-MCG tablet Take 1 tablet by mouth daily.     ketorolac (TORADOL) 10 MG tablet Take 1 tablet (10 mg total) by mouth every 6 (six) hours as needed. 30 tablet 0   methocarbamol (ROBAXIN) 500 MG tablet Take 1 tablet (500 mg total) by mouth 2 (two) times daily. 20 tablet 0   naproxen (NAPROSYN) 500 MG tablet Take 1 tablet (500 mg total) by mouth 2 (two) times daily. 30 tablet 0   ondansetron (ZOFRAN-ODT) 4 MG disintegrating tablet Take 1 tablet (4 mg total) by mouth every 8 (eight) hours as needed for nausea or vomiting. 20 tablet 0  spironolactone (ALDACTONE) 50 MG tablet Take 1 tablet (50 mg total) by mouth daily. 90 tablet 3   No current facility-administered medications for this visit.    Allergies:   Patient has no known allergies.    ROS:  Please see the history of present illness.   Otherwise, review of systems are positive for ***.   All other systems are reviewed and negative.    PHYSICAL EXAM: VS:  LMP 06/22/2022 (Approximate)  , BMI There is no height or weight on file to calculate BMI. GENERAL:  Well appearing NECK:  No jugular venous distention, waveform within normal limits, carotid upstroke brisk and symmetric, no bruits, no thyromegaly LUNGS:  Clear to auscultation bilaterally CHEST:  Unremarkable HEART:  PMI not displaced  or sustained,S1 and S2 within normal limits, no S3, no S4, no clicks, no rubs, *** murmurs ABD:  Flat, positive bowel sounds normal in frequency in pitch, no bruits, no rebound, no guarding, no midline pulsatile mass, no hepatomegaly, no splenomegaly EXT:  2 plus pulses throughout, no edema, no cyanosis no clubbing    ***GENERAL:  Well appearing HEENT:  Pupils equal round and reactive, fundi not visualized, oral mucosa unremarkable NECK:  No jugular venous distention, waveform within normal limits, carotid upstroke brisk and symmetric, no bruits, no thyromegaly LYMPHATICS:  No cervical, inguinal adenopathy LUNGS:  Clear to auscultation bilaterally BACK:  No CVA tenderness CHEST:  Unremarkable HEART:  PMI not displaced or sustained,S1 and S2 within normal limits, no S3, no S4, no clicks, no rubs, no murmurs ABD:  Flat, positive bowel sounds normal in frequency in pitch, no bruits, no rebound, no guarding, no midline pulsatile mass, no hepatomegaly, no splenomegaly EXT:  2 plus pulses throughout, no edema, no cyanosis no clubbing SKIN:  No rashes no nodules NEURO:  Cranial nerves II through XII grossly intact, motor grossly intact throughout PSYCH:  Cognitively intact, oriented to person place and time    EKG:  EKG is *** ordered today. The ekg ordered today demonstrates sinus rhythm, rate ***, axis within normal limits, intervals within normal limits, no acute ST-T wave changes.   Recent Labs: 12/10/2021: ALT 12; Hemoglobin 12.9; Magnesium 5.6; Platelets 140 06/03/2022: TSH 0.602 06/28/2022: BUN 9; Creatinine, Ser 0.73; Potassium 5.1; Sodium 139    Lipid Panel No results found for: "CHOL", "TRIG", "HDL", "CHOLHDL", "VLDL", "LDLCALC", "LDLDIRECT"    Wt Readings from Last 3 Encounters:  08/02/22 175 lb (79.4 kg)  07/06/22 177 lb (80.3 kg)  06/28/22 176 lb (79.8 kg)      Other studies Reviewed: Additional studies/ records that were reviewed today include: *** Review of the  above records demonstrates:  Please see elsewhere in the note.     ASSESSMENT AND PLAN:  Hypertension:   ***  She has had hypertensive urgency.  This is new onset hypertension.  I am going to start spironolactone.  I will check a basic metabolic profile and TSH today.  I do note that the UA did not suggest a secondary etiology previously.  I am going to check a 24-hour urine for VMA and metanephrines.  I will check renal artery Dopplers for stenosis.  We talked about therapeutic lifestyle changes to include increased physical activity and salt restriction.  She is going to keep a blood pressure diary.   Current medicines are reviewed at length with the patient today.  The patient does not have concerns regarding medicines.  The following changes have been made:  ***  Labs/ tests ordered  today include: ***  No orders of the defined types were placed in this encounter.    Disposition:   FU with ***  [facility]    Signed, Rollene Rotunda, MD  08/06/2022 2:58 PM    [facility]

## 2022-08-08 ENCOUNTER — Ambulatory Visit: Payer: Medicaid Other | Admitting: Cardiology

## 2022-08-08 DIAGNOSIS — I160 Hypertensive urgency: Secondary | ICD-10-CM

## 2022-09-01 ENCOUNTER — Ambulatory Visit: Payer: Medicaid Other | Attending: Cardiology | Admitting: Nurse Practitioner

## 2022-09-01 ENCOUNTER — Encounter: Payer: Self-pay | Admitting: Nurse Practitioner

## 2022-09-01 VITALS — BP 118/68 | HR 73 | Ht 66.0 in | Wt 178.0 lb

## 2022-09-01 DIAGNOSIS — I10 Essential (primary) hypertension: Secondary | ICD-10-CM | POA: Diagnosis not present

## 2022-09-01 DIAGNOSIS — G43909 Migraine, unspecified, not intractable, without status migrainosus: Secondary | ICD-10-CM

## 2022-09-01 DIAGNOSIS — R42 Dizziness and giddiness: Secondary | ICD-10-CM

## 2022-09-01 DIAGNOSIS — R0789 Other chest pain: Secondary | ICD-10-CM | POA: Diagnosis not present
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
## 2022-09-01 NOTE — Patient Instructions (Signed)
Medication Instructions:  No Changes *If you need a refill on your cardiac medications before your next appointment, please call your pharmacy*   Lab Work: No Labs If you have labs (blood work) drawn today and your tests are completely normal, you will receive your results only by: MyChart Message (if you have MyChart) OR A paper copy in the mail If you have any lab test that is abnormal or we need to change your treatment, we will call you to review the results.   Testing/Procedures: No Testing   Follow-Up: At [facility], you and your health needs are our priority.  As part of our continuing mission to provide you with exceptional heart care, we have created designated Provider Care Teams.  These Care Teams include your primary Cardiologist (physician) and Advanced Practice Providers (APPs -  Physician Assistants and Nurse Practitioners) who all work together to provide you with the care you need, when you need it.  We recommend signing up for the patient portal called "MyChart".  Sign up information is provided on this After Visit Summary.  MyChart is used to connect with patients for Virtual Visits (Telemedicine).  Patients are able to view lab/test results, encounter notes, upcoming appointments, etc.  Non-urgent messages can be sent to your provider as well.   To learn more about what you can do with MyChart, go to [email or web address].    Your next appointment:   4-6 month(s)  The format for your next appointment:   In Person  Provider:   Rollene Rotunda, MD

## 2022-12-19 ENCOUNTER — Other Ambulatory Visit: Payer: Self-pay

## 2022-12-19 ENCOUNTER — Encounter (HOSPITAL_BASED_OUTPATIENT_CLINIC_OR_DEPARTMENT_OTHER): Payer: Self-pay | Admitting: Pediatrics

## 2022-12-19 ENCOUNTER — Emergency Department (HOSPITAL_BASED_OUTPATIENT_CLINIC_OR_DEPARTMENT_OTHER)
Admission: EM | Admit: 2022-12-19 | Discharge: 2022-12-19 | Discharge status: Against Medical Advice | Payer: Medicaid Other | Attending: Emergency Medicine | Admitting: Emergency Medicine

## 2022-12-19 DIAGNOSIS — Z5321 Procedure and treatment not carried out due to patient leaving prior to being seen by health care provider: Secondary | ICD-10-CM | POA: Insufficient documentation

## 2022-12-19 DIAGNOSIS — R509 Fever, unspecified: Secondary | ICD-10-CM | POA: Insufficient documentation

## 2022-12-19 DIAGNOSIS — R197 Diarrhea, unspecified: Secondary | ICD-10-CM | POA: Diagnosis not present

## 2022-12-19 DIAGNOSIS — R112 Nausea with vomiting, unspecified: Secondary | ICD-10-CM | POA: Diagnosis present

## 2022-12-19 NOTE — ED Notes (Addendum)
2nd call for labs no answer
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
## 2022-12-19 NOTE — ED Notes (Addendum)
Called for labs no answer

## 2023-01-17 ENCOUNTER — Emergency Department (HOSPITAL_COMMUNITY)
Admission: EM | Admit: 2023-01-17 | Discharge: 2023-01-17 | Disposition: A | Discharge status: Home / Self Care | Payer: Medicaid Other | Attending: Emergency Medicine | Admitting: Emergency Medicine

## 2023-01-17 DIAGNOSIS — Z1152 Encounter for screening for COVID-19: Secondary | ICD-10-CM | POA: Diagnosis not present

## 2023-01-17 DIAGNOSIS — B349 Viral infection, unspecified: Secondary | ICD-10-CM | POA: Insufficient documentation

## 2023-01-17 DIAGNOSIS — R112 Nausea with vomiting, unspecified: Secondary | ICD-10-CM | POA: Diagnosis present

## 2023-01-17 DIAGNOSIS — Z7982 Long term (current) use of aspirin: Secondary | ICD-10-CM | POA: Insufficient documentation

## 2023-01-17 DIAGNOSIS — Z8616 Personal history of COVID-19: Secondary | ICD-10-CM | POA: Insufficient documentation

## 2023-01-17 LAB — CBC WITH DIFFERENTIAL/PLATELET
Abs Immature Granulocytes: 0.04 10*3/uL (ref 0.00–0.07)
Basophils Absolute: 0 10*3/uL (ref 0.0–0.1)
Basophils Relative: 0 %
Eosinophils Absolute: 0 10*3/uL (ref 0.0–0.5)
Eosinophils Relative: 0 %
HCT: 42.6 % (ref 36.0–46.0)
Hemoglobin: 14.7 g/dL (ref 12.0–15.0)
Immature Granulocytes: 0 %
Lymphocytes Relative: 8 %
Lymphs Abs: 0.9 10*3/uL (ref 0.7–4.0)
MCH: 31.7 pg (ref 26.0–34.0)
MCHC: 34.5 g/dL (ref 30.0–36.0)
MCV: 91.8 fL (ref 80.0–100.0)
Monocytes Absolute: 1.4 10*3/uL — ABNORMAL HIGH (ref 0.1–1.0)
Monocytes Relative: 11 %
Neutro Abs: 9.6 10*3/uL — ABNORMAL HIGH (ref 1.7–7.7)
Neutrophils Relative %: 81 %
Platelets: 179 10*3/uL (ref 150–400)
RBC: 4.64 MIL/uL (ref 3.87–5.11)
RDW: 12.6 % (ref 11.5–15.5)
WBC: 12 10*3/uL — ABNORMAL HIGH (ref 4.0–10.5)
nRBC: 0 % (ref 0.0–0.2)

## 2023-01-17 LAB — COMPREHENSIVE METABOLIC PANEL
ALT: 14 U/L (ref 0–44)
AST: 14 U/L — ABNORMAL LOW (ref 15–41)
Albumin: 3.4 g/dL — ABNORMAL LOW (ref 3.5–5.0)
Alkaline Phosphatase: 64 U/L (ref 38–126)
Anion gap: 15 (ref 5–15)
BUN: 10 mg/dL (ref 6–20)
CO2: 24 mmol/L (ref 22–32)
Calcium: 8.5 mg/dL — ABNORMAL LOW (ref 8.9–10.3)
Chloride: 95 mmol/L — ABNORMAL LOW (ref 98–111)
Creatinine, Ser: 0.96 mg/dL (ref 0.44–1.00)
GFR, Estimated: >60 mL/min (ref >60)
Glucose, Bld: 133 mg/dL — ABNORMAL HIGH (ref 70–99)
Potassium: 3.4 mmol/L — ABNORMAL LOW (ref 3.5–5.1)
Sodium: 134 mmol/L — ABNORMAL LOW (ref 135–145)
Total Bilirubin: 1.3 mg/dL — ABNORMAL HIGH (ref 0.3–1.2)
Total Protein: 8.1 g/dL (ref 6.5–8.1)

## 2023-01-17 LAB — RESP PANEL BY RT-PCR (RSV, FLU A&B, COVID)  RVPGX2
Influenza A by PCR: NEGATIVE
Influenza B by PCR: NEGATIVE
Resp Syncytial Virus by PCR: NEGATIVE
SARS Coronavirus 2 by RT PCR: NEGATIVE

## 2023-01-17 LAB — LIPASE, BLOOD: Lipase: 26 U/L (ref 11–51)

## 2023-01-17 MED ORDER — ONDANSETRON 4 MG PO TBDP: 4.0000 mg | ORAL_TABLET | Freq: Three times a day (TID) | ORAL | 0 refills | Status: AC | PRN

## 2023-01-17 MED ORDER — SODIUM CHLORIDE 0.9 % IV BOLUS
1000.0000 mL | Freq: Once | INTRAVENOUS | Status: AC
  Administered 2023-01-17: 1000 mL via INTRAVENOUS

## 2023-01-17 MED ORDER — ONDANSETRON HCL 4 MG/2ML IJ SOLN
4.0000 mg | Freq: Once | INTRAMUSCULAR | Status: AC
  Administered 2023-01-17: 4 mg via INTRAVENOUS
  Filled 2023-01-17: qty 2

## 2023-01-17 NOTE — ED Provider Notes (Signed)
[place] EMERGENCY DEPARTMENT AT [facility] Provider Note   CSN: [id number] Arrival date & time: 01/17/23  1939     History  No chief complaint on file.   Joanna Mcpherson is a 37 y.o. female.  HPI Patient presents with nausea vomiting diarrhea.  Began on Saturday with today being Tuesday but had initially had runny nose cough body aches.  Some upper abdominal pain.  States she has had chills.  Is hungry but states everything she eats will come up and also having diarrhea.  Some abdominal pain.  States she is on spironolactone for high blood pressure.  No known sick contacts.  Denies possibility of pregnancy.    Home Medications Prior to Admission medications   Medication Sig Start Date End Date Taking? Authorizing Provider  ondansetron (ZOFRAN-ODT) 4 MG disintegrating tablet Take 1 tablet (4 mg total) by mouth every 8 (eight) hours as needed for nausea or vomiting. 01/17/23  Yes Davonna Belling, MD  acetaminophen (TYLENOL) 325 MG tablet Take 2 tablets (650 mg total) by mouth every 6 (six) hours as needed (for pain scale < 4). Patient taking differently: Take 650 mg by mouth every 6 (six) hours as needed for mild pain. 11/28/21   Rowland Lathe, MD  aspirin-acetaminophen-caffeine (EXCEDRIN MIGRAINE) [phone] MG tablet Take 2 tablets by mouth every 6 (six) hours as needed for headache.    [provider]  CRYSELLE-28 0.3-30 MG-MCG tablet Take 1 tablet by mouth daily. 03/31/22   [provider]  methocarbamol (ROBAXIN) 500 MG tablet Take 1 tablet (500 mg total) by mouth 2 (two) times daily. 08/02/22   Henderly, Britni A, PA-C  naproxen (NAPROSYN) 500 MG tablet Take 1 tablet (500 mg total) by mouth 2 (two) times daily. 08/02/22   Henderly, Britni A, PA-C  spironolactone (ALDACTONE) 50 MG tablet Take 1 tablet (50 mg total) by mouth daily. 06/03/22   Minus Breeding, MD      Allergies    Patient has no known allergies.    Review of Systems    Review of Systems  Physical Exam Updated Vital Signs BP 106/65   Pulse 100   Temp 99.1 F (37.3 C) (Oral)   Resp 18   Ht 5\' 6"  (1.676 m)   Wt 83.9 kg   LMP 01/03/2023 (Approximate)   SpO2 100%   BMI 29.86 kg/m  Physical Exam Vitals and nursing note reviewed.  HENT:     Head: Atraumatic.  Eyes:     Pupils: Pupils are equal, round, and reactive to light.  Cardiovascular:     Rate and Rhythm: Regular rhythm.  Pulmonary:     Breath sounds: No wheezing.  Abdominal:     Tenderness: There is abdominal tenderness.     Comments:  what today looking for upper abdominal tenderness no rebound or guarding.  No hernias palpated.  Musculoskeletal:        General: No tenderness.  Skin:    General: Skin is warm.     Coloration: Skin is not jaundiced.  Neurological:     Mental Status: She is alert.     ED Results / Procedures / Treatments   Labs (all labs ordered are listed, but only abnormal results are displayed) Labs Reviewed  COMPREHENSIVE METABOLIC PANEL - Abnormal; Notable for the following components:      Result Value   Sodium 134 (*)    Potassium 3.4 (*)    Chloride 95 (*)    Glucose, Bld 133 (*)  Calcium 8.5 (*)    Albumin 3.4 (*)    AST 14 (*)    Total Bilirubin 1.3 (*)    All other components within normal limits  CBC WITH DIFFERENTIAL/PLATELET - Abnormal; Notable for the following components:   WBC 12.0 (*)    Neutro Abs 9.6 (*)    Monocytes Absolute 1.4 (*)    All other components within normal limits  RESP PANEL BY RT-PCR (RSV, FLU A&B, COVID)  RVPGX2  LIPASE, BLOOD    EKG None  Radiology No results found.  Procedures Procedures    Medications Ordered in ED Medications  sodium chloride 0.9 % bolus 1,000 mL (0 mLs Intravenous Stopped 01/17/23 2209)  ondansetron (ZOFRAN) injection 4 mg (4 mg Intravenous Given 01/17/23 2006)    ED Course/ Medical Decision Making/ A&P                             Medical Decision Making Amount and/or  Complexity of Data Reviewed Labs: ordered.  Risk Prescription drug management.   Patient with URI type symptoms but also nausea vomiting diarrhea.  Has had for the last few days.  Decreased oral intake.  Differential diagnosis includes viral syndrome also potentially abdominal pathology.  Will get basic blood work.  Will give fluid bolus and antiemetics.  Will check flu COVID and RSV testing.  Flu COVID RSV negative.  Giving treatment.  Feeling better after treatment.  Tolerating orals.  Vitals reassuring.  Discharge home.        Final Clinical Impression(s) / ED Diagnoses Final diagnoses:  Nausea vomiting and diarrhea  Viral syndrome    Rx / DC Orders ED Discharge Orders          Ordered    ondansetron (ZOFRAN-ODT) 4 MG disintegrating tablet  Every 8 hours PRN        01/17/23 2155              Davonna Belling, MD 01/17/23 2303

## 2023-01-17 NOTE — ED Triage Notes (Signed)
Patient arrived stating since Saturday she's had a runny nose, cough, body aches, NVD and side pain from coughing. Last dose of Tylenol at 1pm.

## 2023-01-23 NOTE — Progress Notes (Deleted)
Cardiology Office Note   Date:  01/23/2023   ID:  Ressa, Tapani 1986-03-15, MRN [id number]  PCP:  Vanessa Kick, MD  Cardiologist:   Minus Breeding, MD Referring:  Vanessa Kick, MD  No chief complaint on file.     History of Present Illness: Joanna Mcpherson is a 37 y.o. female who presents for evaluation of difficult to control HTN.  She has a history of preeclampsia.  ***   ***She was hospitalized for this in December.  She said that this was her fourth child.  She is never really had trouble and was not hypertensive before this pregnancy.  She was found to have hypertension when she presented with significant headaches.  She was treated with Procardia but actually has not been on this having had some trouble getting the prescription.  She says her blood pressure fluctuates.  Is been 185/100 and 1 in the morning recently.  It does go down sometimes in the afternoon.  She does describe palpitations episodically.  She says this seems to be happening more frequently and she thought it might have been associated with the Procardia.  She feels palpitations and hot flashes.  She has not had any new presyncope or syncope.  She has not had any new chest pressure, neck or arm discomfort.  She goes up and down stairs.  She takes care of her children and also has a job.  She stopped drinking caffeine.  She has no shortness of breath, PND or orthopnea.  She is not having chest pain.   Past Medical History:  Diagnosis Date   Abnormal Pap smear    Asthma    no issue in 30 yrs   Headache(784.0)    Hx of migraines last may 2021   Miscarriage 04/2020    Past Surgical History:  Procedure Laterality Date   ANKLE FRACTURE SURGERY Left 14 yrs ago   [place] N/A 10/18/2020   Procedure: DILATATION AND EVACUATION;  Surgeon: Rowland Lathe, MD;  Location: [place];  Service: Gynecology;  Laterality: N/A;   elective abortion      HYSTEROSCOPY WITH D & C N/A 06/19/2020   Procedure: DILATATION AND CURETTAGE /HYSTEROSCOPY WITH HYSTEROSCOPIC MYOMECTOMY;  Surgeon: Vanessa Kick, MD;  Location: [facility];  Service: Gynecology;  Laterality: N/A;     Current Outpatient Medications  Medication Sig Dispense Refill   acetaminophen (TYLENOL) 325 MG tablet Take 2 tablets (650 mg total) by mouth every 6 (six) hours as needed (for pain scale < 4). (Patient taking differently: Take 650 mg by mouth every 6 (six) hours as needed for mild pain.)     aspirin-acetaminophen-caffeine (EXCEDRIN MIGRAINE) 250-250-65 MG tablet Take 2 tablets by mouth every 6 (six) hours as needed for headache.     CRYSELLE-28 0.3-30 MG-MCG tablet Take 1 tablet by mouth daily.     methocarbamol (ROBAXIN) 500 MG tablet Take 1 tablet (500 mg total) by mouth 2 (two) times daily. 20 tablet 0   naproxen (NAPROSYN) 500 MG tablet Take 1 tablet (500 mg total) by mouth 2 (two) times daily. 30 tablet 0   ondansetron (ZOFRAN-ODT) 4 MG disintegrating tablet Take 1 tablet (4 mg total) by mouth every 8 (eight) hours as needed for nausea or vomiting. 8 tablet 0   spironolactone (ALDACTONE) 50 MG tablet Take 1 tablet (50 mg total) by mouth daily. 90 tablet 3   No current facility-administered  medications for this visit.    Allergies:   Patient has no known allergies.   ROS:  Please see the history of present illness.   Otherwise, review of systems are positive for ***.   All other systems are reviewed and negative.    PHYSICAL EXAM: VS:  LMP 01/03/2023 (Approximate)  , BMI There is no height or weight on file to calculate BMI. GENERAL:  Well appearing NECK:  No jugular venous distention, waveform within normal limits, carotid upstroke brisk and symmetric, no bruits, no thyromegaly LUNGS:  Clear to auscultation bilaterally CHEST:  Unremarkable HEART:  PMI not displaced or sustained,S1 and S2 within normal limits, no S3, no S4, no clicks, no rubs, ***  murmurs ABD:  Flat, positive bowel sounds normal in frequency in pitch, no bruits, no rebound, no guarding, no midline pulsatile mass, no hepatomegaly, no splenomegaly EXT:  2 plus pulses throughout, no edema, no cyanosis no clubbing    ***GENERAL:  Well appearing HEENT:  Pupils equal round and reactive, fundi not visualized, oral mucosa unremarkable NECK:  No jugular venous distention, waveform within normal limits, carotid upstroke brisk and symmetric, no bruits, no thyromegaly LYMPHATICS:  No cervical, inguinal adenopathy LUNGS:  Clear to auscultation bilaterally BACK:  No CVA tenderness CHEST:  Unremarkable HEART:  PMI not displaced or sustained,S1 and S2 within normal limits, no S3, no S4, no clicks, no rubs, no murmurs ABD:  Flat, positive bowel sounds normal in frequency in pitch, no bruits, no rebound, no guarding, no midline pulsatile mass, no hepatomegaly, no splenomegaly EXT:  2 plus pulses throughout, no edema, no cyanosis no clubbing SKIN:  No rashes no nodules NEURO:  Cranial nerves II through XII grossly intact, motor grossly intact throughout PSYCH:  Cognitively intact, oriented to person place and time    EKG:  EKG is *** ordered today. The ekg ordered today demonstrates sinus rhythm, rate ***, axis within normal limits, intervals within normal limits, no acute ST-T wave changes.   Recent Labs: 06/03/2022: TSH 0.602 01/17/2023: ALT 14; BUN 10; Creatinine, Ser 0.96; Hemoglobin 14.7; Platelets 179; Potassium 3.4; Sodium 134    Lipid Panel No results found for: "CHOL", "TRIG", "HDL", "CHOLHDL", "VLDL", "LDLCALC", "LDLDIRECT"    Wt Readings from Last 3 Encounters:  01/17/23 185 lb (83.9 kg)  12/19/22 185 lb (83.9 kg)  09/01/22 178 lb (80.7 kg)      Other studies Reviewed: Additional studies/ records that were reviewed today include: ***. Review of the above records demonstrates:  Please see elsewhere in the note.     ASSESSMENT AND PLAN:  Hypertension: She  has had hypertensive urgency.  *** This is new onset hypertension.  I am going to start spironolactone.  I will check a basic metabolic profile and TSH today.  I do note that the UA did not suggest a secondary etiology previously.  I am going to check a 24-hour urine for VMA and metanephrines.  I will check renal artery Dopplers for stenosis.  We talked about therapeutic lifestyle changes to include increased physical activity and salt restriction.  She is going to keep a blood pressure diary.   Current medicines are reviewed at length with the patient today.  The patient does not have concerns regarding medicines.  The following changes have been made:  ***  Labs/ tests ordered today include: ***  No orders of the defined types were placed in this encounter.    Disposition:   FU with ***   Signed, Minus Breeding,  MD  01/23/2023 8:15 AM     [facility]

## 2023-01-24 ENCOUNTER — Ambulatory Visit: Payer: Medicaid Other | Admitting: Cardiology

## 2023-01-24 DIAGNOSIS — I160 Hypertensive urgency: Secondary | ICD-10-CM

## 2023-02-01 NOTE — Progress Notes (Signed)
Cardiology Office Note   Date:  02/02/2023   ID:  Joanna Mcpherson, Joanna Mcpherson 10-Feb-1986, MRN [id number]  PCP:  Waynard Reeds, MD  Cardiologist:   Rollene Rotunda, MD Referring:  Waynard Reeds, MD   Chief Complaint  Patient presents with   Hypertension      History of Present Illness: Cyan Millennium Sieker is a 37 y.o. female who presents for evaluation of difficult to control HTN.  She has a history of preeclampsia.  She was treated with frontal lactone.  VMA's were negative.  Renal Dopplers were unremarkable.  In January she was in the emergency room and I reviewed these records.  She had nausea vomiting and diarrhea.  She was taken off her spironolactone at that time and she just never restarted it.  However, her blood pressures have been well-controlled as listed below.  She denies any ongoing cardiovascular symptoms.  She is busy taking care of several small children at home.  She was up and down stairs and has no cardiovascular complaints.  She denies chest pressure, neck or arm discomfort.  She has had no weight gain or edema.  Past Medical History:  Diagnosis Date   Abnormal Pap smear    Asthma    no issue in 30 yrs   Headache(784.0)    Hx of migraines last may 2021   Miscarriage 04/2020    Past Surgical History:  Procedure Laterality Date   ANKLE FRACTURE SURGERY Left 14 yrs ago   CERVICAL [place] BIOPSY     DILATION AND EVACUATION N/A 10/18/2020   Procedure: DILATATION AND EVACUATION;  Surgeon: Charlett Nose, MD;  Location: [facility];  Service: Gynecology;  Laterality: N/A;   elective abortion     HYSTEROSCOPY WITH D & C N/A 06/19/2020   Procedure: DILATATION AND CURETTAGE /HYSTEROSCOPY WITH HYSTEROSCOPIC MYOMECTOMY;  Surgeon: Waynard Reeds, MD;  Location: [facility];  Service: Gynecology;  Laterality: N/A;     Current Outpatient Medications  Medication Sig Dispense Refill   acetaminophen (TYLENOL) 325 MG tablet Take 2 tablets (650 mg total) by  mouth every 6 (six) hours as needed (for pain scale < 4). (Patient not taking: Reported on 02/02/2023)     aspirin-acetaminophen-caffeine (EXCEDRIN MIGRAINE) 250-250-65 MG tablet Take 2 tablets by mouth every 6 (six) hours as needed for headache. (Patient not taking: Reported on 02/02/2023)     CRYSELLE-28 0.3-30 MG-MCG tablet Take 1 tablet by mouth daily. (Patient not taking: Reported on 02/02/2023)     methocarbamol (ROBAXIN) 500 MG tablet Take 1 tablet (500 mg total) by mouth 2 (two) times daily. (Patient not taking: Reported on 02/02/2023) 20 tablet 0   naproxen (NAPROSYN) 500 MG tablet Take 1 tablet (500 mg total) by mouth 2 (two) times daily. (Patient not taking: Reported on 02/02/2023) 30 tablet 0   ondansetron (ZOFRAN-ODT) 4 MG disintegrating tablet Take 1 tablet (4 mg total) by mouth every 8 (eight) hours as needed for nausea or vomiting. (Patient not taking: Reported on 02/02/2023) 8 tablet 0   No current facility-administered medications for this visit.    Allergies:   Patient has no known allergies.   ROS:  Please see the history of present illness.   Otherwise, review of systems are positive for none.   All other systems are reviewed and negative.    PHYSICAL EXAM: VS:  BP 122/84 (BP Location: Left Arm, Patient Position: Sitting, Cuff Size: Normal)   Pulse 96   Ht 5\' 6"  (  1.676 m)   Wt 182 lb 12.8 oz (82.9 kg)   LMP 01/03/2023 (Approximate)   SpO2 97%   BMI 29.50 kg/m  , BMI Body mass index is 29.5 kg/m. GENERAL:  Well appearing NECK:  No jugular venous distention, waveform within normal limits, carotid upstroke brisk and symmetric, no bruits, no thyromegaly LUNGS:  Clear to auscultation bilaterally CHEST:  Unremarkable HEART:  PMI not displaced or sustained,S1 and S2 within normal limits, no S3, no S4, no clicks, no rubs, no murmurs ABD:  Flat, positive bowel sounds normal in frequency in pitch, no bruits, no rebound, no guarding, no midline pulsatile mass, no hepatomegaly, no  splenomegaly EXT:  2 plus pulses throughout, no edema, no cyanosis no clubbing   EKG:  EKG is not ordered today.   Recent Labs: 06/03/2022: TSH 0.602 01/17/2023: ALT 14; BUN 10; Creatinine, Ser 0.96; Hemoglobin 14.7; Platelets 179; Potassium 3.4; Sodium 134    Lipid Panel No results found for: "CHOL", "TRIG", "HDL", "CHOLHDL", "VLDL", "LDLCALC", "LDLDIRECT"    Wt Readings from Last 3 Encounters:  02/02/23 182 lb 12.8 oz (82.9 kg)  01/17/23 185 lb (83.9 kg)  12/19/22 185 lb (83.9 kg)      Other studies Reviewed: Additional studies/ records that were reviewed today include: None. Review of the above records demonstrates: NA   ASSESSMENT AND PLAN:  Hypertension: She has had hypertensive urgency.  However, now her blood pressure is well-controlled.  No change in therapy.  No further workup.  She will keep an eye on her blood pressure a few times a week and let me know if it starts running high at which point she might need to restart therapy.    Current medicines are reviewed at length with the patient today.  The patient does not have concerns regarding medicines.  The following changes have been made:  None  Labs/ tests ordered today include:   No orders of the defined types were placed in this encounter.    Disposition:   FU with me as needed.     Signed, Rollene Rotunda, MD  02/02/2023 5:28 PM    [facility]

## 2023-02-02 ENCOUNTER — Ambulatory Visit: Payer: Medicaid Other | Attending: Cardiology | Admitting: Cardiology

## 2023-02-02 ENCOUNTER — Encounter: Payer: Self-pay | Admitting: Cardiology

## 2023-02-02 VITALS — BP 122/84 | HR 96 | Ht 66.0 in | Wt 182.8 lb

## 2023-02-02 DIAGNOSIS — I10 Essential (primary) hypertension: Secondary | ICD-10-CM

## 2023-02-02 NOTE — Patient Instructions (Signed)
    Follow-Up: At [facility], you and your health needs are our priority.  As part of our continuing mission to provide you with exceptional heart care, we have created designated Provider Care Teams.  These Care Teams include your primary Cardiologist (physician) and Advanced Practice Providers (APPs -  Physician Assistants and Nurse Practitioners) who all work together to provide you with the care you need, when you need it.  We recommend signing up for the patient portal called "MyChart".  Sign up information is provided on this After Visit Summary.  MyChart is used to connect with patients for Virtual Visits (Telemedicine).  Patients are able to view lab/test results, encounter notes, upcoming appointments, etc.  Non-urgent messages can be sent to your provider as well.   To learn more about what you can do with MyChart, go to [email or web address].    Your next appointment:   As needed        

## 2023-11-09 ENCOUNTER — Other Ambulatory Visit: Payer: Self-pay

## 2023-11-09 ENCOUNTER — Emergency Department (HOSPITAL_BASED_OUTPATIENT_CLINIC_OR_DEPARTMENT_OTHER)
Admission: EM | Admit: 2023-11-09 | Discharge: 2023-11-09 | Disposition: A | Discharge status: Home / Self Care | Payer: Medicaid Other | Attending: Emergency Medicine | Admitting: Emergency Medicine

## 2023-11-09 ENCOUNTER — Encounter (HOSPITAL_BASED_OUTPATIENT_CLINIC_OR_DEPARTMENT_OTHER): Payer: Self-pay

## 2023-11-09 DIAGNOSIS — Z7982 Long term (current) use of aspirin: Secondary | ICD-10-CM | POA: Insufficient documentation

## 2023-11-09 DIAGNOSIS — N898 Other specified noninflammatory disorders of vagina: Secondary | ICD-10-CM | POA: Diagnosis present

## 2023-11-09 DIAGNOSIS — J45909 Unspecified asthma, uncomplicated: Secondary | ICD-10-CM | POA: Insufficient documentation

## 2023-11-09 DIAGNOSIS — Z711 Person with feared health complaint in whom no diagnosis is made: Secondary | ICD-10-CM | POA: Diagnosis not present

## 2023-11-09 LAB — URINALYSIS, ROUTINE W REFLEX MICROSCOPIC
Bilirubin Urine: NEGATIVE
Glucose, UA: NEGATIVE mg/dL
Hgb urine dipstick: NEGATIVE
Ketones, ur: NEGATIVE mg/dL
Leukocytes,Ua: NEGATIVE
Nitrite: NEGATIVE
Protein, ur: NEGATIVE mg/dL
Specific Gravity, Urine: 1.025 (ref 1.005–1.030)
pH: 6 (ref 5.0–8.0)

## 2023-11-09 LAB — WET PREP, GENITAL
Clue Cells Wet Prep HPF POC: NONE SEEN
Sperm: NONE SEEN
Trich, Wet Prep: NONE SEEN
WBC, Wet Prep HPF POC: <10 (ref <10)
Yeast Wet Prep HPF POC: NONE SEEN

## 2023-11-09 LAB — PREGNANCY, URINE: Preg Test, Ur: NEGATIVE

## 2023-11-09 MED ORDER — ACETAMINOPHEN 500 MG PO TABS
1000.0000 mg | ORAL_TABLET | Freq: Once | ORAL | Status: AC
  Administered 2023-11-09: 1000 mg via ORAL
  Filled 2023-11-09: qty 2

## 2023-11-09 NOTE — ED Provider Notes (Signed)
[place] EMERGENCY DEPARTMENT AT [facility] Provider Note   CSN: [id number] Arrival date & time: 11/09/23  7425     History  Chief Complaint  Patient presents with   Vaginal Discharge    Joanna Mcpherson is a 37 y.o. female.   Vaginal Discharge Patient presents with mild vaginal discharge and itchiness.  Think she may have a yeast infection.  Treated empirically by PCP a month ago for BV.  No pelvic exam been done.  Does not think she has an STD.  States some cottage cheeselike discharge.    Past Medical History:  Diagnosis Date   Abnormal Pap smear    Asthma    no issue in 30 yrs   Headache(784.0)    Hx of migraines last may 2021   Miscarriage 04/2020    Home Medications Prior to Admission medications   Medication Sig Start Date End Date Taking? Authorizing Provider  acetaminophen (TYLENOL) 325 MG tablet Take 2 tablets (650 mg total) by mouth every 6 (six) hours as needed (for pain scale < 4). Patient not taking: Reported on 02/02/2023 11/28/21   Charlett Nose, MD  aspirin-acetaminophen-caffeine [facility] MIGRAINE) [phone] MG tablet Take 2 tablets by mouth every 6 (six) hours as needed for headache. Patient not taking: Reported on 02/02/2023    [provider]  CRYSELLE-28 0.3-30 MG-MCG tablet Take 1 tablet by mouth daily. Patient not taking: Reported on 02/02/2023 03/31/22   [provider]  methocarbamol (ROBAXIN) 500 MG tablet Take 1 tablet (500 mg total) by mouth 2 (two) times daily. Patient not taking: Reported on 02/02/2023 08/02/22   Henderly, Britni A, PA-C  naproxen (NAPROSYN) 500 MG tablet Take 1 tablet (500 mg total) by mouth 2 (two) times daily. Patient not taking: Reported on 02/02/2023 08/02/22   Henderly, Britni A, PA-C  ondansetron (ZOFRAN-ODT) 4 MG disintegrating tablet Take 1 tablet (4 mg total) by mouth every 8 (eight) hours as needed for nausea or vomiting. Patient not taking: Reported on 02/02/2023 01/17/23    Benjiman Core, MD      Allergies    Patient has no known allergies.    Review of Systems   Review of Systems  Genitourinary:  Positive for vaginal discharge.    Physical Exam Updated Vital Signs BP (!) 123/93   Pulse 94   Temp 98 F (36.7 C) (Oral)   Resp 16   Ht 5\' 6"  (1.676 m)   Wt 83.9 kg   LMP 10/16/2023 (Exact Date)   SpO2 100%   BMI 29.86 kg/m  Physical Exam Vitals reviewed.  Cardiovascular:     Rate and Rhythm: Regular rhythm.  Abdominal:     Tenderness: There is no abdominal tenderness.  Neurological:     Mental Status: She is alert.     ED Results / Procedures / Treatments   Labs (all labs ordered are listed, but only abnormal results are displayed) Labs Reviewed  WET PREP, GENITAL  URINALYSIS, ROUTINE W REFLEX MICROSCOPIC  PREGNANCY, URINE  GC/CHLAMYDIA PROBE AMP ([place]) NOT AT [facility]    EKG None  Radiology No results found.  Procedures Procedures    Medications Ordered in ED Medications  acetaminophen (TYLENOL) tablet 1,000 mg (1,000 mg Oral Given 11/09/23 4332)    ED Course/ Medical Decision Making/ A&P  Medical Decision Making Amount and/or Complexity of Data Reviewed Labs: ordered.  Risk OTC drugs.   Patient with vaginal discharge.  Recently treated for BV although had not been tested before.  Discussed with patient about potential pelvic exam.  However wet prep had already been done off patient self swab before I saw her.  It came back normal.  Offered pelvic exam but for now we will just go off the self swab results.  Gonorrhea and chlamydia also ordered and pending.  Discussed about testing for syphilis and HIV but patient defers at this time.  Potentially could just be returning to her normal flora after treatment for BV.  Appears stable for discharge home.  Benign abdominal exam.        Final Clinical Impression(s) / ED Diagnoses Final diagnoses:  Physically well but worried     Rx / DC Orders ED Discharge Orders     None         Benjiman Core, MD 11/09/23 1428

## 2023-11-09 NOTE — ED Triage Notes (Signed)
Pt recently tx for BV 1 week ago. Pt usually has yeast infections after taking abx and wants to make sure she does not have one today. Reports cottage cheese like discharge and "off smell". Endorses itching. Possible exposure to STI's, unsure.

## 2023-11-09 NOTE — Discharge Instructions (Signed)
The testing did not show any infections.

## 2023-11-10 LAB — GC/CHLAMYDIA PROBE AMP (~~LOC~~) NOT AT ARMC
Chlamydia: NEGATIVE
Comment: NEGATIVE
Comment: NORMAL
Neisseria Gonorrhea: NEGATIVE

## 2024-02-24 ENCOUNTER — Other Ambulatory Visit: Payer: Self-pay

## 2024-02-24 ENCOUNTER — Ambulatory Visit
Admission: EM | Admit: 2024-02-24 | Discharge: 2024-02-24 | Disposition: A | Discharge status: Home / Self Care | Attending: Family Medicine | Admitting: Family Medicine

## 2024-02-24 ENCOUNTER — Ambulatory Visit (INDEPENDENT_AMBULATORY_CARE_PROVIDER_SITE_OTHER): Admitting: Radiology

## 2024-02-24 DIAGNOSIS — S63611A Unspecified sprain of left index finger, initial encounter: Secondary | ICD-10-CM

## 2024-02-24 DIAGNOSIS — S6992XA Unspecified injury of left wrist, hand and finger(s), initial encounter: Secondary | ICD-10-CM

## 2024-02-24 DIAGNOSIS — T7491XA Unspecified adult maltreatment, confirmed, initial encounter: Secondary | ICD-10-CM

## 2024-02-24 DIAGNOSIS — S060X0A Concussion without loss of consciousness, initial encounter: Secondary | ICD-10-CM
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
## 2024-02-24 NOTE — Discharge Instructions (Addendum)
 Over the counter NSAIDs for pain: Ibuprofen 400 mg ( Ibuprofen, Advil or Motrin, 2 tablets) and Acetaminophen 1 gram (3 of the regular strength or 2 extra strength tablets) 3-4 times a day,  take on an empty stomach before each meal and bedtime (every 6 hours) for a few days Do not take if you are pregnant/breastfeeding. Allergic to NSAIDs have a history of ulcers, intestinal bleeding or liver or kidney disease or have taken an opioid.   Call 911 if you feel you are in danger.  Follow-up with an orthopedist if your finger and hand symptoms fail to improve.  The finger brace for a few days for support Up with your primary care doctor regarding your concussion   The x-ray reading we discussed is preliminary. Your x-ray will be read by a radiologist in next few hours. If there is a discrepancy, you will be contacted, and instructed on a new plan for you care.

## 2024-02-24 NOTE — ED Notes (Addendum)
 Finger splint applied to left pointer finger by Marylene Land PA prior to departure.
